# Patient Record
Sex: Male | Born: 1955 | Hispanic: Yes | Marital: Married | Smoking: Never smoker
Health system: Southern US, Community
[De-identification: ages and names within clinical notes are randomized; demographics above are authoritative.]

## PROBLEM LIST (undated history)

## (undated) HISTORY — PX: CARDIAC CATHETERIZATION: SHX172

---

## 2021-02-21 ENCOUNTER — Other Ambulatory Visit: Payer: Self-pay

## 2021-02-21 ENCOUNTER — Ambulatory Visit: Payer: 59 | Admitting: Cardiology

## 2021-02-21 ENCOUNTER — Encounter: Payer: Self-pay | Admitting: Cardiology

## 2021-02-21 VITALS — BP 142/61 | HR 102 | Temp 98.4°F | Resp 17 | Ht 65.0 in | Wt 157.0 lb

## 2021-02-21 DIAGNOSIS — I480 Paroxysmal atrial fibrillation: Secondary | ICD-10-CM

## 2021-02-21 DIAGNOSIS — I25119 Atherosclerotic heart disease of native coronary artery with unspecified angina pectoris: Secondary | ICD-10-CM

## 2021-02-21 DIAGNOSIS — I10 Essential (primary) hypertension: Secondary | ICD-10-CM

## 2021-02-21 DIAGNOSIS — R2681 Unsteadiness on feet: Secondary | ICD-10-CM

## 2021-02-21 DIAGNOSIS — I951 Orthostatic hypotension: Secondary | ICD-10-CM

## 2021-02-21 NOTE — Progress Notes (Addendum)
Primary Physician/Referring:  Pcp, No  Patient ID: Dennis Fry, male    DOB: 14-Sep-1955, 66 y.o.   MRN: [id number]  No chief complaint on file.  HPI:    Dennis Fry  is a 66 y.o. Caucasian male patient from [place] who came to the [place] on 02/13/2021 a week ago.  He has history of coronary artery disease, paroxysmal atrial fibrillation, hyperlipidemia, diabetes mellitus, had coronary stent implantation to the LAD implanted 3 months ago.  New onset of gait instability and generalized slowing and headache that started 5 to 6 days ago.    No chest pain, dyspnea, leg edema. No fall. C/O headache improved on Tylenol.  History reviewed. No pertinent past medical history. Past Surgical History:  Procedure Laterality Date   CARDIAC CATHETERIZATION     Family History  Problem Relation Age of Onset   Heart attack Father 36   Heart attack Sister 66   Heart attack Sister 61   Lung cancer Sister    Heart attack Brother     Social History   Tobacco Use   Smoking status: Never   Smokeless tobacco: Never  Substance Use Topics   Alcohol use: Never   Marital Status: Married  ROS  Review of Systems  Cardiovascular:  Negative for chest pain, dyspnea on exertion and leg swelling.  Neurological:  Positive for disturbances in coordination and headaches.  Objective  Blood pressure (!) 142/61, pulse (!) 102, temperature 98.4 F (36.9 C), temperature source Temporal, resp. rate 17, height $RemoveBe'5\' 5"'ihvQiIzZk$  (1.651 m), weight 157 lb (71.2 kg), SpO2 98 %. Body mass index is 26.13 kg/m.   Orthostatic VS for the past 72 hrs (Last 3 readings):  Orthostatic BP Patient Position BP Location Cuff Size Orthostatic Pulse  02/21/21 1236 138/51 Standing Left Arm Normal (!) 41  02/21/21 1235 124/61 Sitting Left Arm Normal (!) 48  02/21/21 1234 158/87 Supine Left Arm Normal 116    Vitals with BMI 02/21/2021  Height $Remov'5\' 5"'GHahDr$   Weight 157 lbs  BMI 02.72  Systolic 536  Diastolic 61  Pulse 644     Physical Exam Eyes:     Extraocular Movements: Extraocular movements intact.     Conjunctiva/sclera: Conjunctivae normal.  Neck:     Vascular: No carotid bruit or JVD.  Cardiovascular:     Rate and Rhythm: Normal rate and regular rhythm.     Pulses: Intact distal pulses.     Heart sounds: Normal heart sounds. No murmur heard.   No gallop.  Pulmonary:     Effort: Pulmonary effort is normal.     Breath sounds: Normal breath sounds.  Abdominal:     General: Bowel sounds are normal.     Palpations: Abdomen is soft.  Musculoskeletal:     Right lower leg: No edema.     Left lower leg: No edema.  Skin:    Capillary Refill: Capillary refill takes less than 2 seconds.  Neurological:     Motor: Weakness (left arm) present.     Gait: Gait abnormal.     Medications and allergies  No Known Allergies   Medication list after today's encounter   Current Outpatient Medications  Medication Instructions   apixaban (ELIQUIS) 5 mg, Oral, 2 times daily   aspirin EC 81 mg, Oral, Daily, Swallow whole.   atorvastatin (LIPITOR) 40 mg, Oral, Daily   bisoprolol (ZEBETA) 2.5 mg, Oral, Daily   empagliflozin (JARDIANCE) 25 MG TABS tablet Oral, Daily   esomeprazole (NEXIUM) 20 mg,  Oral, Daily   glipiZIDE (GLUCOTROL) 60 mg, Oral, Daily before breakfast   sitaGLIPtin-metformin (JANUMET) 50-1000 MG tablet 1 tablet, Oral, 2 times daily with meals   Laboratory xamination:   Recent Labs    02/21/21 1409  NA 136  K 4.2  CL 97  CO2 22  GLUCOSE 160*  BUN 19  CREATININE 0.87  CALCIUM 10.0   estimated creatinine clearance is 70.7 mL/min (by C-G formula based on SCr of 0.87 mg/dL).  CMP Latest Ref Rng & Units 02/21/2021  Glucose 70 - 99 mg/dL 160(H)  BUN 8 - 27 mg/dL 19  Creatinine 0.76 - 1.27 mg/dL 0.87  Sodium 134 - 144 mmol/L 136  Potassium 3.5 - 5.2 mmol/L 4.2  Chloride 96 - 106 mmol/L 97  CO2 20 - 29 mmol/L 22  Calcium 8.6 - 10.2 mg/dL 10.0  Total Protein 6.0 - 8.5 g/dL 7.9  Total  Bilirubin 0.0 - 1.2 mg/dL 0.4  Alkaline Phos 44 - 121 IU/L 144(H)  AST 0 - 40 IU/L 21  ALT 0 - 44 IU/L 16   CBC Latest Ref Rng & Units 02/21/2021  WBC 3.4 - 10.8 x10E3/uL 13.2(H)  Hemoglobin 13.0 - 17.7 g/dL 14.2  Hematocrit 37.5 - 51.0 % 44.6  Platelets 150 - 450 x10E3/uL 318    Radiology:     Cardiac Studies:    EKG:   02/21/2021: Normal sinus rhythm at rate of 65 bpm, normal axis, early repolarization.  PACs.  Assessment     ICD-10-CM   1. Coronary artery disease with angina pectoris, unspecified vessel or lesion type, unspecified whether native or transplanted heart ([place])  I25.119 CMP14+EGFR    CBC    CANCELED: EKG 12-Lead    2. Gait instability  R26.81 CT HEAD WO CONTRAST (5MM)    CT HEAD W & WO CONTRAST (5MM)    3. Paroxysmal atrial fibrillation (H[facility])  I48.0     4. Primary hypertension  I10     5. Orthostatic hypotension  I95.1        Medications Discontinued During This Encounter  Medication Reason   ARGATROBAN IV    METFORMIN HCL PO    clopidogrel (PLAVIX) 75 MG tablet Discontinued by provider    No orders of the defined types were placed in this encounter.  Orders Placed This Encounter  Procedures   CT HEAD WO CONTRAST (5MM)    [employer] (auth#[id number])  epic order WT 157 No special needs  No spinal cord stimulator/body injector/glucose/heart monitor/port attached No COVID Pansy with Ajanna @ [phone]     Standing Status:   Future    Standing Expiration Date:   02/21/2022    Order Specific Question:   Preferred imaging location?    Answer:   [id number]   CT HEAD W & WO CONTRAST (5MM)    Standing Status:   Future    Standing Expiration Date:   02/22/2022    Order Specific Question:   If indicated for the ordered procedure, I authorize the administration of contrast media per Radiology protocol    Answer:   Yes    Order Specific Question:   Preferred imaging location?    Answer:   [facility]   CMP14+EGFR   CBC   Recommendations:    Dennis Fry is a 66 y.o. Caucasian male patient from [place] who came to the [place] on 02/13/2021 a week ago.  He has history of coronary artery disease, paroxysmal atrial fibrillation, hyperlipidemia, diabetes  mellitus, had coronary stent implantation to the LAD implanted 3 months ago.  New onset of gait instability and generalized slowing and headache that started 5 to 6 days ago.  On physical exam he appears to have mild left arm weakness.  He also has gait instability.  I am concerned about either a stroke that occurred 5 days ago or intracerebral hemorrhage as patient is on aspirin, Plavix and also Eliquis. No nuchal rigidity.   They will try to get records from [place] in the interim I have recommended that they get a stat CT scan of the head to exclude intracerebral hemorrhage and also a CMP and a CBC to the minimum.  They would like him to travel back to [place] if there is not an acute emergency.  He is also mildly orthostatic today and suspect this could be from dehydration as well.  I did not make any changes to his medications however as coronary stent was 3 months ago, you could certainly discontinue either aspirin or Plavix and continue Eliquis.  Addendum: CMP and CBC normal.  We will change CT scan of the head to CT angiogram head.  Discontinue clopidogrel and continue Eliquis and aspirin.    Adrian Prows, MD, [facility] 02/22/2021, 10:25 AM Office: [phone]

## 2021-02-22 ENCOUNTER — Other Ambulatory Visit: Payer: Self-pay

## 2021-02-22 LAB — CMP14+EGFR
ALT: 16 IU/L (ref 0–44)
AST: 21 IU/L (ref 0–40)
Albumin/Globulin Ratio: 1.3 (ref 1.2–2.2)
Albumin: 4.4 g/dL (ref 3.8–4.8)
Alkaline Phosphatase: 144 IU/L — ABNORMAL HIGH (ref 44–121)
BUN/Creatinine Ratio: 22 (ref 10–24)
BUN: 19 mg/dL (ref 8–27)
Bilirubin Total: 0.4 mg/dL (ref 0.0–1.2)
CO2: 22 mmol/L (ref 20–29)
Calcium: 10 mg/dL (ref 8.6–10.2)
Chloride: 97 mmol/L (ref 96–106)
Creatinine, Ser: 0.87 mg/dL (ref 0.76–1.27)
Globulin, Total: 3.5 g/dL (ref 1.5–4.5)
Glucose: 160 mg/dL — ABNORMAL HIGH (ref 70–99)
Potassium: 4.2 mmol/L (ref 3.5–5.2)
Sodium: 136 mmol/L (ref 134–144)
Total Protein: 7.9 g/dL (ref 6.0–8.5)
eGFR: 94 mL/min/{1.73_m2} (ref >59)

## 2021-02-22 LAB — CBC
Hematocrit: 44.6 % (ref 37.5–51.0)
Hemoglobin: 14.2 g/dL (ref 13.0–17.7)
MCH: 22.9 pg — ABNORMAL LOW (ref 26.6–33.0)
MCHC: 31.8 g/dL (ref 31.5–35.7)
MCV: 72 fL — ABNORMAL LOW (ref 79–97)
Platelets: 318 10*3/uL (ref 150–450)
RBC: 6.2 x10E6/uL — ABNORMAL HIGH (ref 4.14–5.80)
RDW: 14.6 % (ref 11.6–15.4)
WBC: 13.2 10*3/uL — ABNORMAL HIGH (ref 3.4–10.8)

## 2021-02-22 NOTE — Addendum Note (Signed)
Addended by: Kela Millin on: 02/22/2021 10:25 AM   Modules accepted: Orders

## 2021-02-23 ENCOUNTER — Emergency Department (HOSPITAL_COMMUNITY): Payer: Commercial Managed Care - PPO

## 2021-02-23 ENCOUNTER — Inpatient Hospital Stay (HOSPITAL_COMMUNITY): Payer: Commercial Managed Care - PPO

## 2021-02-23 ENCOUNTER — Inpatient Hospital Stay (HOSPITAL_COMMUNITY)
Admission: EM | Admit: 2021-02-23 | Discharge: 2021-02-24 | DRG: 054 | Discharge status: Against Medical Advice | Payer: Commercial Managed Care - PPO | Attending: Neurology | Admitting: Neurology

## 2021-02-23 ENCOUNTER — Ambulatory Visit
Admission: RE | Admit: 2021-02-23 | Discharge: 2021-02-23 | Disposition: A | Discharge status: Home / Self Care | Payer: 59 | Source: Ambulatory Visit | Attending: Cardiology | Admitting: Cardiology

## 2021-02-23 ENCOUNTER — Other Ambulatory Visit: Payer: Self-pay | Admitting: Internal Medicine

## 2021-02-23 ENCOUNTER — Other Ambulatory Visit: Payer: Self-pay

## 2021-02-23 DIAGNOSIS — Z20822 Contact with and (suspected) exposure to covid-19: Secondary | ICD-10-CM | POA: Diagnosis present

## 2021-02-23 DIAGNOSIS — Z8249 Family history of ischemic heart disease and other diseases of the circulatory system: Secondary | ICD-10-CM | POA: Diagnosis not present

## 2021-02-23 DIAGNOSIS — I2510 Atherosclerotic heart disease of native coronary artery without angina pectoris: Secondary | ICD-10-CM | POA: Diagnosis present

## 2021-02-23 DIAGNOSIS — I6389 Other cerebral infarction: Secondary | ICD-10-CM

## 2021-02-23 DIAGNOSIS — I480 Paroxysmal atrial fibrillation: Secondary | ICD-10-CM | POA: Diagnosis present

## 2021-02-23 DIAGNOSIS — Z79899 Other long term (current) drug therapy: Secondary | ICD-10-CM

## 2021-02-23 DIAGNOSIS — Z7901 Long term (current) use of anticoagulants: Secondary | ICD-10-CM

## 2021-02-23 DIAGNOSIS — C719 Malignant neoplasm of brain, unspecified: Secondary | ICD-10-CM | POA: Diagnosis present

## 2021-02-23 DIAGNOSIS — R262 Difficulty in walking, not elsewhere classified: Secondary | ICD-10-CM | POA: Diagnosis present

## 2021-02-23 DIAGNOSIS — Z7984 Long term (current) use of oral hypoglycemic drugs: Secondary | ICD-10-CM

## 2021-02-23 DIAGNOSIS — Z955 Presence of coronary angioplasty implant and graft: Secondary | ICD-10-CM | POA: Diagnosis not present

## 2021-02-23 DIAGNOSIS — E119 Type 2 diabetes mellitus without complications: Secondary | ICD-10-CM | POA: Diagnosis present

## 2021-02-23 DIAGNOSIS — I639 Cerebral infarction, unspecified: Secondary | ICD-10-CM

## 2021-02-23 DIAGNOSIS — I10 Essential (primary) hypertension: Secondary | ICD-10-CM | POA: Diagnosis present

## 2021-02-23 DIAGNOSIS — E785 Hyperlipidemia, unspecified: Secondary | ICD-10-CM | POA: Diagnosis present

## 2021-02-23 DIAGNOSIS — C801 Malignant (primary) neoplasm, unspecified: Secondary | ICD-10-CM

## 2021-02-23 DIAGNOSIS — G936 Cerebral edema: Secondary | ICD-10-CM | POA: Diagnosis present

## 2021-02-23 DIAGNOSIS — Z801 Family history of malignant neoplasm of trachea, bronchus and lung: Secondary | ICD-10-CM | POA: Diagnosis not present

## 2021-02-23 DIAGNOSIS — R2689 Other abnormalities of gait and mobility: Secondary | ICD-10-CM | POA: Diagnosis present

## 2021-02-23 DIAGNOSIS — Z7902 Long term (current) use of antithrombotics/antiplatelets: Secondary | ICD-10-CM | POA: Diagnosis not present

## 2021-02-23 DIAGNOSIS — R2681 Unsteadiness on feet: Secondary | ICD-10-CM

## 2021-02-23 LAB — CBC
HCT: 45.6 % (ref 39.0–52.0)
Hemoglobin: 14.2 g/dL (ref 13.0–17.0)
MCH: 22.5 pg — ABNORMAL LOW (ref 26.0–34.0)
MCHC: 31.1 g/dL (ref 30.0–36.0)
MCV: 72.2 fL — ABNORMAL LOW (ref 80.0–100.0)
Platelets: 294 10*3/uL (ref 150–400)
RBC: 6.32 MIL/uL — ABNORMAL HIGH (ref 4.22–5.81)
RDW: 14.7 % (ref 11.5–15.5)
WBC: 13.4 10*3/uL — ABNORMAL HIGH (ref 4.0–10.5)
nRBC: 0 % (ref 0.0–0.2)

## 2021-02-23 LAB — ECHOCARDIOGRAM COMPLETE
AV Vena cont: 0.1 cm
Area-P 1/2: 2.99 cm2
Calc EF: 61.2 %
Height: 65 in
P 1/2 time: 691 msec
S' Lateral: 2.5 cm
Single Plane A2C EF: 59.2 %
Single Plane A4C EF: 62.9 %
Weight: 2512 oz

## 2021-02-23 LAB — COMPREHENSIVE METABOLIC PANEL
ALT: 16 U/L (ref 0–44)
AST: 19 U/L (ref 15–41)
Albumin: 3.7 g/dL (ref 3.5–5.0)
Alkaline Phosphatase: 86 U/L (ref 38–126)
Anion gap: 14 (ref 5–15)
BUN: 22 mg/dL (ref 8–23)
CO2: 20 mmol/L — ABNORMAL LOW (ref 22–32)
Calcium: 8.9 mg/dL (ref 8.9–10.3)
Chloride: 97 mmol/L — ABNORMAL LOW (ref 98–111)
Creatinine, Ser: 0.85 mg/dL (ref 0.61–1.24)
GFR, Estimated: >60 mL/min (ref >60)
Glucose, Bld: 92 mg/dL (ref 70–99)
Potassium: 3.7 mmol/L (ref 3.5–5.1)
Sodium: 131 mmol/L — ABNORMAL LOW (ref 135–145)
Total Bilirubin: 1 mg/dL (ref 0.3–1.2)
Total Protein: 7.7 g/dL (ref 6.5–8.1)

## 2021-02-23 LAB — LIPID PANEL
Cholesterol: 107 mg/dL (ref 0–200)
HDL: 29 mg/dL — ABNORMAL LOW (ref >40)
LDL Cholesterol: 57 mg/dL (ref 0–99)
Total CHOL/HDL Ratio: 3.7 RATIO
Triglycerides: 107 mg/dL (ref <150)
VLDL: 21 mg/dL (ref 0–40)

## 2021-02-23 LAB — RAPID URINE DRUG SCREEN, HOSP PERFORMED
Amphetamines: NOT DETECTED
Barbiturates: NOT DETECTED
Benzodiazepines: NOT DETECTED
Cocaine: NOT DETECTED
Opiates: NOT DETECTED
Tetrahydrocannabinol: NOT DETECTED

## 2021-02-23 LAB — I-STAT CHEM 8, ED
BUN: 22 mg/dL (ref 8–23)
Calcium, Ion: 1.05 mmol/L — ABNORMAL LOW (ref 1.15–1.40)
Chloride: 100 mmol/L (ref 98–111)
Creatinine, Ser: 0.7 mg/dL (ref 0.61–1.24)
Glucose, Bld: 93 mg/dL (ref 70–99)
HCT: 47 % (ref 39.0–52.0)
Hemoglobin: 16 g/dL (ref 13.0–17.0)
Potassium: 3.8 mmol/L (ref 3.5–5.1)
Sodium: 132 mmol/L — ABNORMAL LOW (ref 135–145)
TCO2: 23 mmol/L (ref 22–32)

## 2021-02-23 LAB — DIFFERENTIAL
Abs Immature Granulocytes: 0.13 10*3/uL — ABNORMAL HIGH (ref 0.00–0.07)
Basophils Absolute: 0 10*3/uL (ref 0.0–0.1)
Basophils Relative: 0 %
Eosinophils Absolute: 0.3 10*3/uL (ref 0.0–0.5)
Eosinophils Relative: 2 %
Immature Granulocytes: 1 %
Lymphocytes Relative: 26 %
Lymphs Abs: 3.5 10*3/uL (ref 0.7–4.0)
Monocytes Absolute: 0.9 10*3/uL (ref 0.1–1.0)
Monocytes Relative: 7 %
Neutro Abs: 8.4 10*3/uL — ABNORMAL HIGH (ref 1.7–7.7)
Neutrophils Relative %: 64 %

## 2021-02-23 LAB — RESP PANEL BY RT-PCR (FLU A&B, COVID) ARPGX2
Influenza A by PCR: NEGATIVE
Influenza B by PCR: NEGATIVE
SARS Coronavirus 2 by RT PCR: NEGATIVE

## 2021-02-23 LAB — APTT: aPTT: 33 seconds (ref 24–36)

## 2021-02-23 LAB — PROTIME-INR
INR: 1.3 — ABNORMAL HIGH (ref 0.8–1.2)
Prothrombin Time: 16.4 seconds — ABNORMAL HIGH (ref 11.4–15.2)

## 2021-02-23 LAB — HEMOGLOBIN A1C
Hgb A1c MFr Bld: 8.9 % — ABNORMAL HIGH (ref 4.8–5.6)
Mean Plasma Glucose: 208.73 mg/dL

## 2021-02-23 LAB — SODIUM: Sodium: 132 mmol/L — ABNORMAL LOW (ref 135–145)

## 2021-02-23 LAB — ETHANOL: Alcohol, Ethyl (B): <10 mg/dL (ref <10)

## 2021-02-23 IMAGING — CT CT HEAD W/O CM
4 series · 15 of 47 positions shown, 17 images · non-contrast
Comparison: None.

CLINICAL DATA: Gait instability.



[Series 3: head 5.00 hr40 s3 axial ibhc · axial · 0.46mm/px · z∈[-603,-473]mm · 7 of 36 slices shown, 9 images]
[im 5/36  brain]
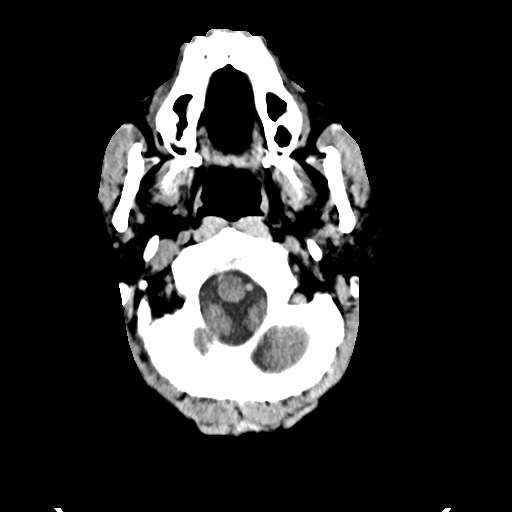
[im 5/36  bone]
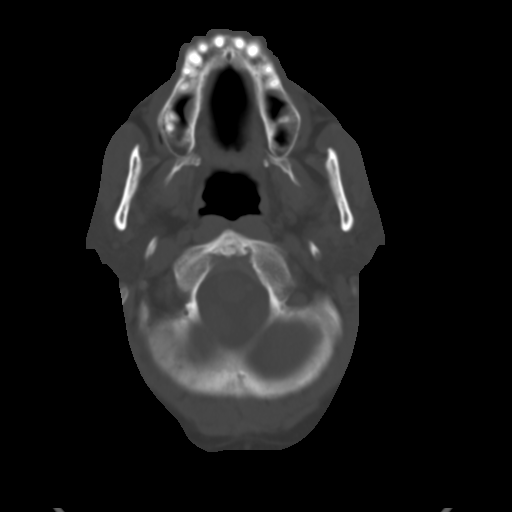
[im 9/36  brain]
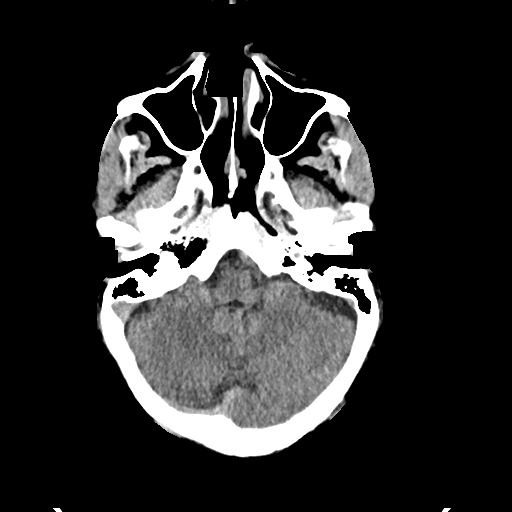
[im 14/36  brain]
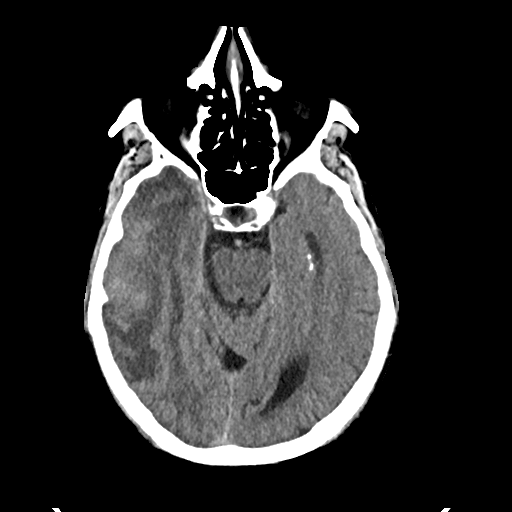
[im 18/36  brain]
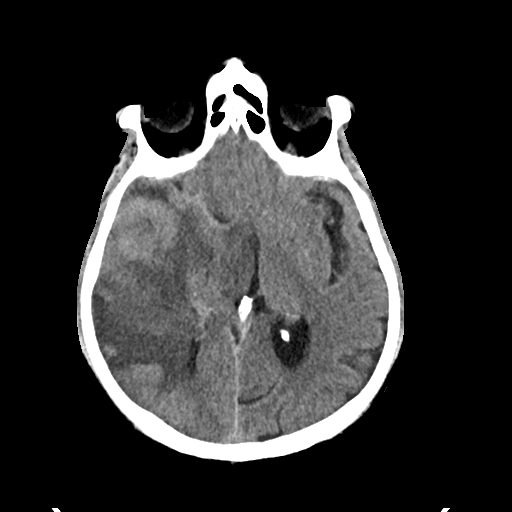
[im 22/36  brain]
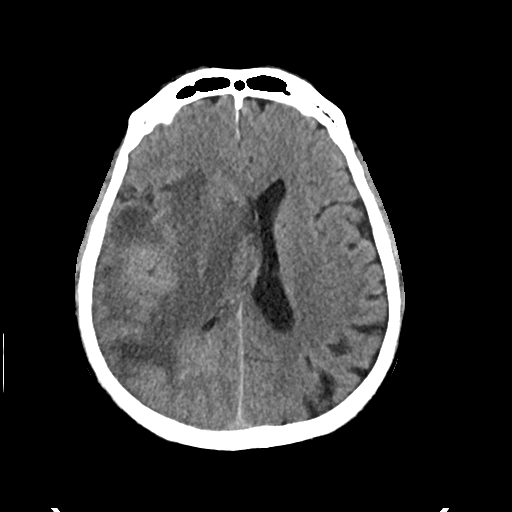
[im 22/36  bone]
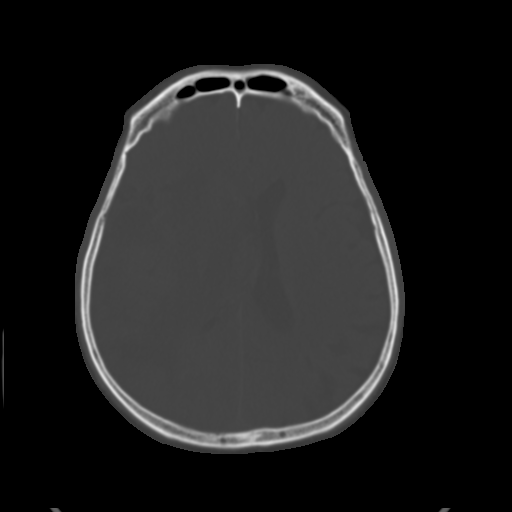
[im 27/36  brain]
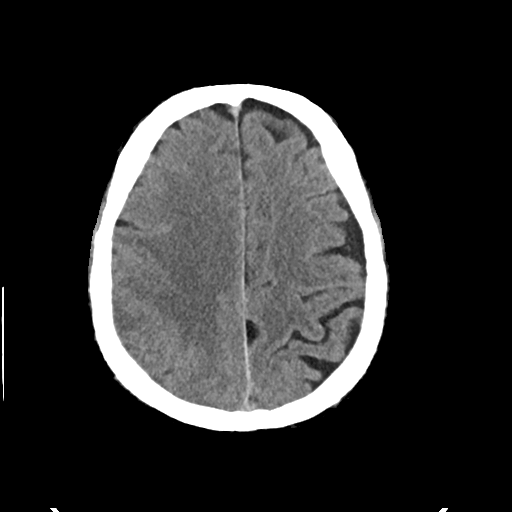
[im 31/36  brain]
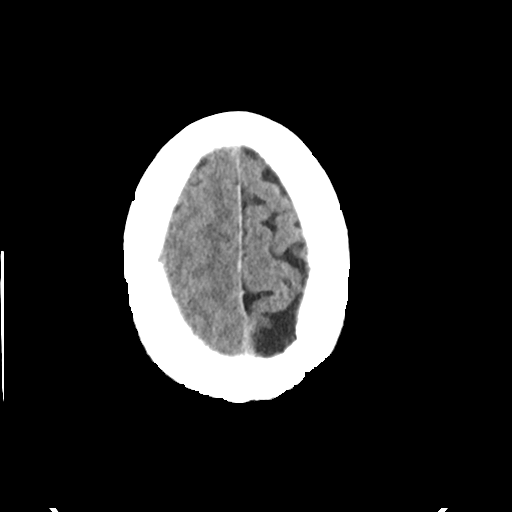

[Series 4: head 2.00 hr60 s3 axial bone · axial · 0.46mm/px · z∈[-606,-588]mm · 2 of 91 slices shown]
[im 10/91  bone]
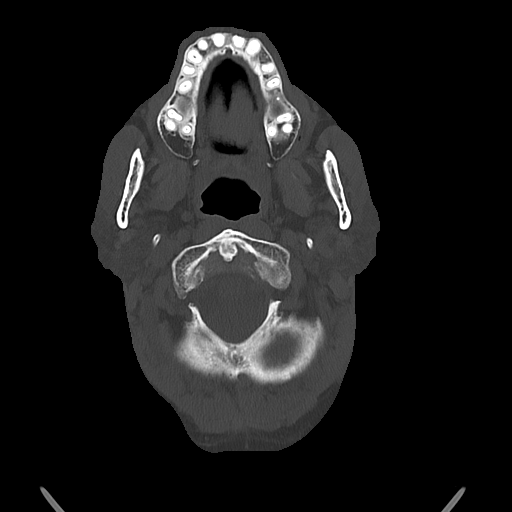
[im 19/91  bone]
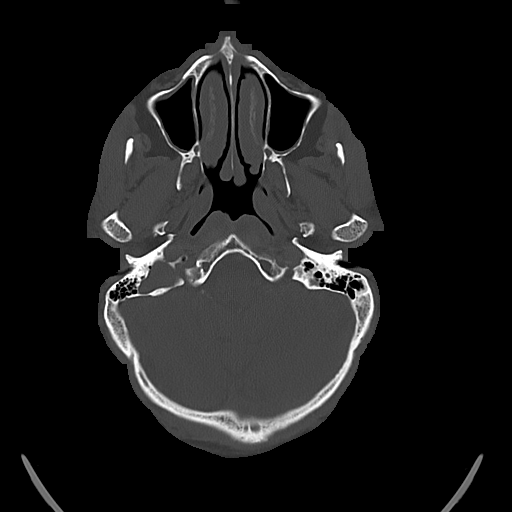

[Series 5: head 3.00 hr40 s3 sag · sagittal · 0.35mm/px · 3 of 57 slices shown]
[im 19/57  brain]
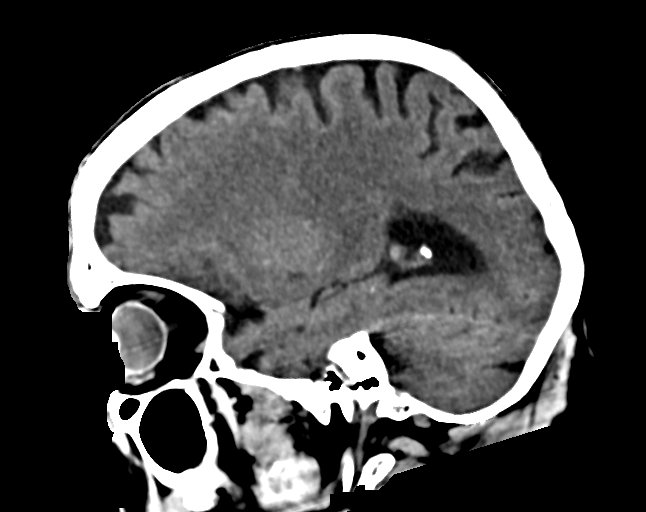
[im 29/57  brain]
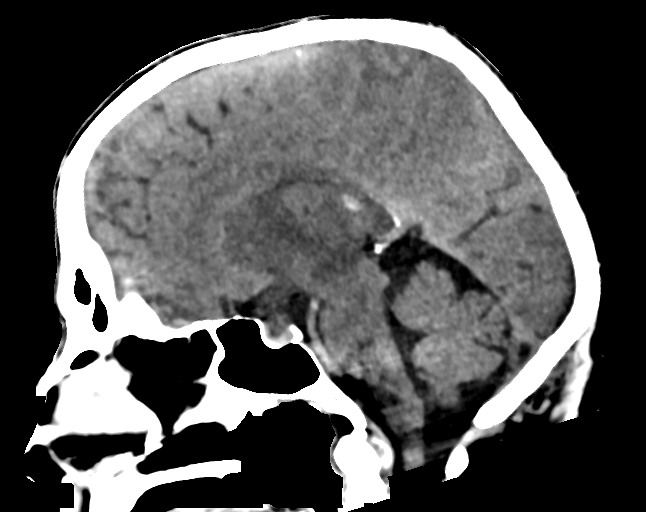
[im 38/57  brain]
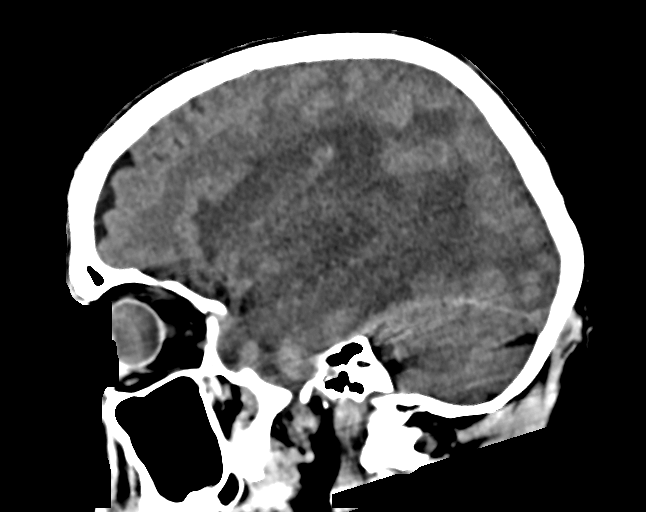

[Series 7: head 3.00 hr40 s3 cor · coronal · 0.34mm/px · 3 of 75 slices shown]
[im 25/75  brain]
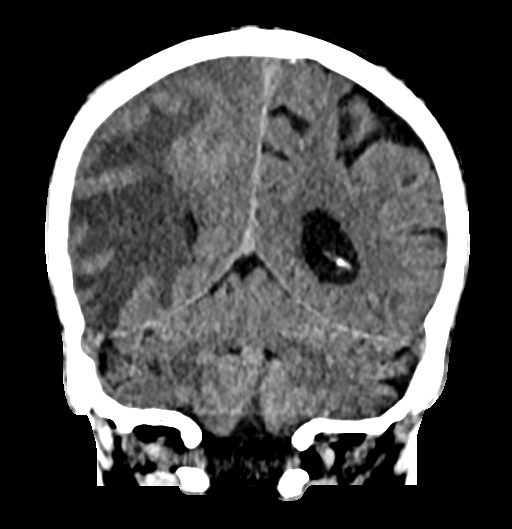
[im 33/75  brain]
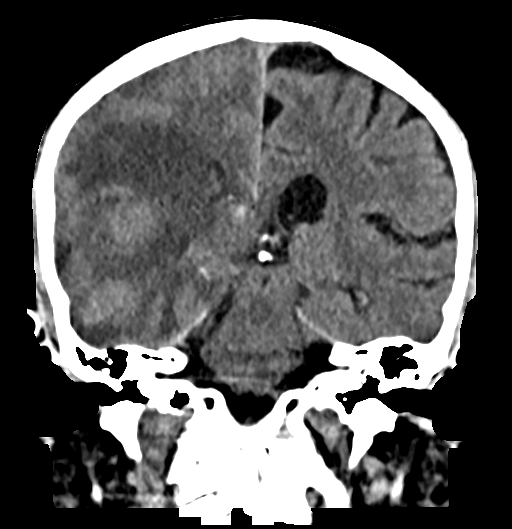
[im 42/75  brain]
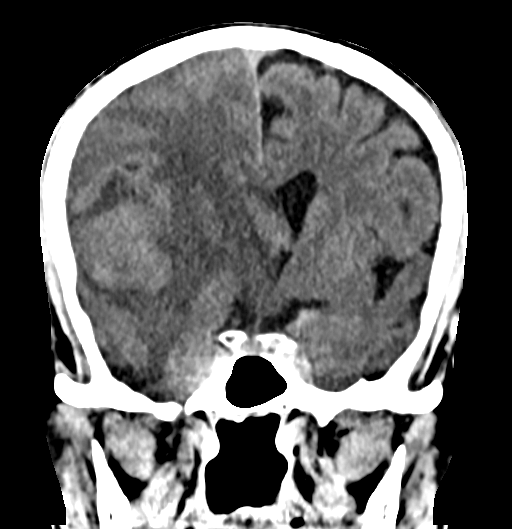

[15 of 47 positions shown; findings below may reference images not displayed]

FINDINGS: Brain: There is extensive subcortical low density consistent with
edema resulting in significant mass effect in midline shift. There
is approximately 11 mm of right to left midline shift. Compression
of the right lateral ventricle is noted. High density material is
noted in the right temporal horn which may represent the choroid
plexus which is accentuated due to the surrounding low density,
although possible intra ventricular hemorrhage cannot be excluded.
Prominence of the cortical gyri of the right temporal and parietal
lobes is also noted which most likely is essential weighted due to
the surrounding low density, although underlying mass or possible
hemorrhage cannot be excluded.

Vascular: Relative high density is noted in the right MCA which may
be due to extend to a shin due to the surrounding low density,
although thrombus cannot be excluded.

Skull: Normal. Negative for fracture or focal lesion.

Sinuses/Orbits: No acute finding.

Other: None.
IMPRESSION: Extensive probable subcortical white matter low density is noted
resulting in significant mass effect and 11 mm of right to left
midline shift. Hyperdensity is noted in right temporal horn which
most likely represents choroid plexus accentuated due to surrounding
low density, but intraventricular hemorrhage cannot be excluded.
Prominent and thickened cortical gyri are noted in right temporal
and parietal cortices which are more prominent most likely due to
surrounding low density, but hemorrhage cannot be excluded.
Possibility of right MCA thrombus cannot be excluded. Further
evaluation with MRI is recommended. Critical Value/emergent results
were called by telephone at the time of interpretation on [DATE]
at [DATE] to provider MUEMIN , who verbally acknowledged these
results.

## 2021-02-23 IMAGING — MR MR HEAD W/O CM
4 of 8 series · 13 of 48 positions shown · non-contrast
Comparison: No pertinent prior exams available for comparison.

CLINICAL DATA: Provided history: Neuro deficit, acute, stroke
suspected.

EXAM:
MRI HEAD WITHOUT CONTRAST
TECHNIQUE: Multiplanar, multiecho pulse sequences of the brain and surrounding
structures were obtained without intravenous contrast.

[Series 3: DWI · axial · 3.0mm · 0.94mm/px · z∈[-84,+24]mm · 3 of 104 slices shown (1 of 2)]
[im 16/104]
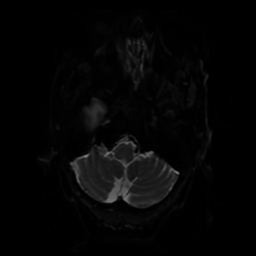
[im 56/104]
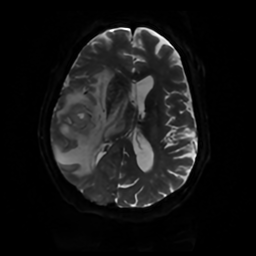
[im 88/104]
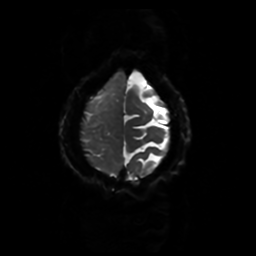

[Series 4: DWI · coronal · 4.0mm · 0.94mm/px · 3 of 76 slices shown (2 of 2)]
[im 9/76]
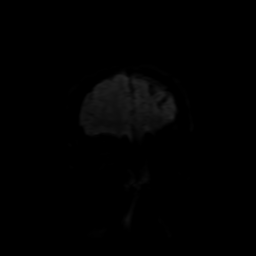
[im 42/76]
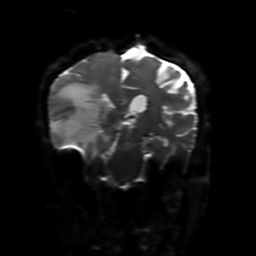
[im 67/76]
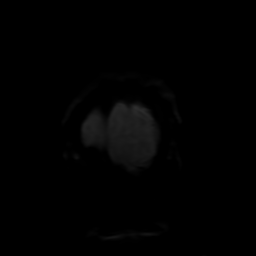

[Series 5: FLAIR · sagittal · 5.0mm · 0.23mm/px · 3 of 28 slices shown (1 of 2)]
[im 1/28]
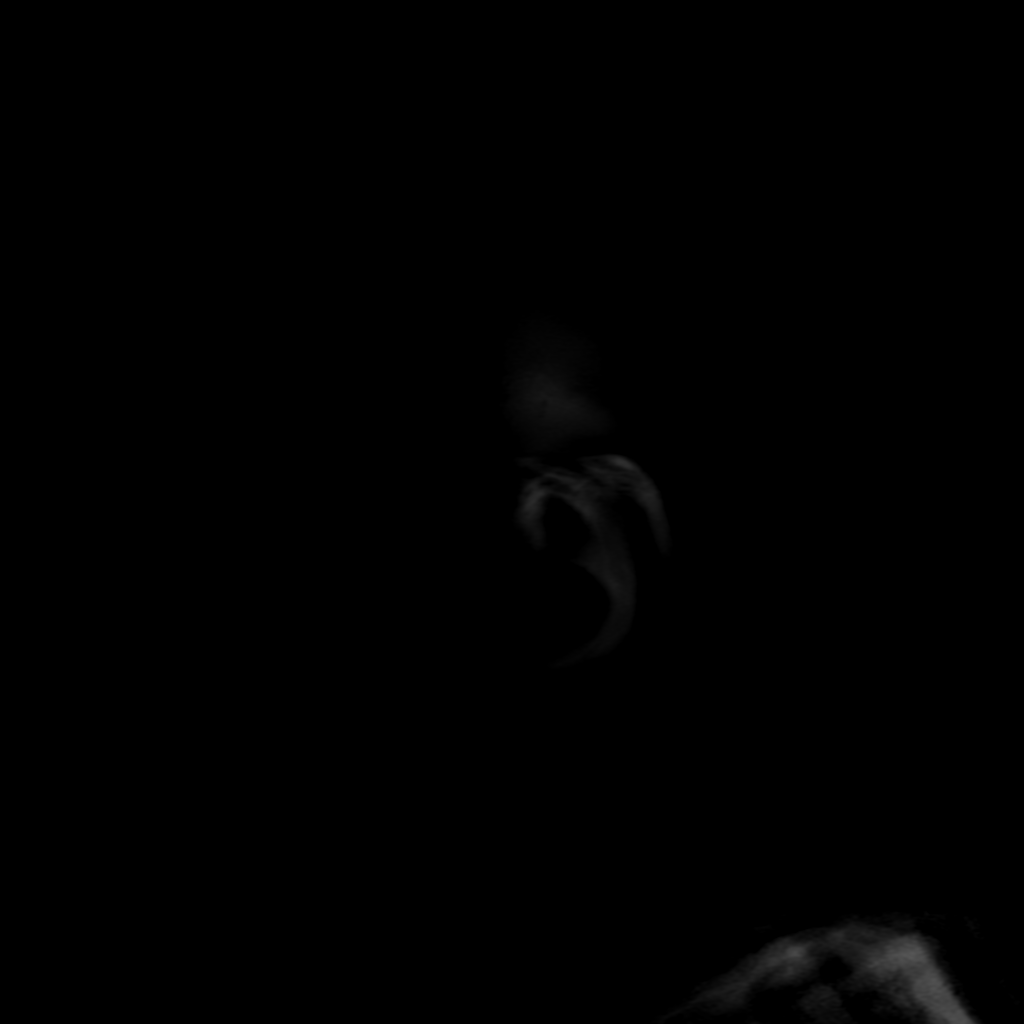
[im 14/28]
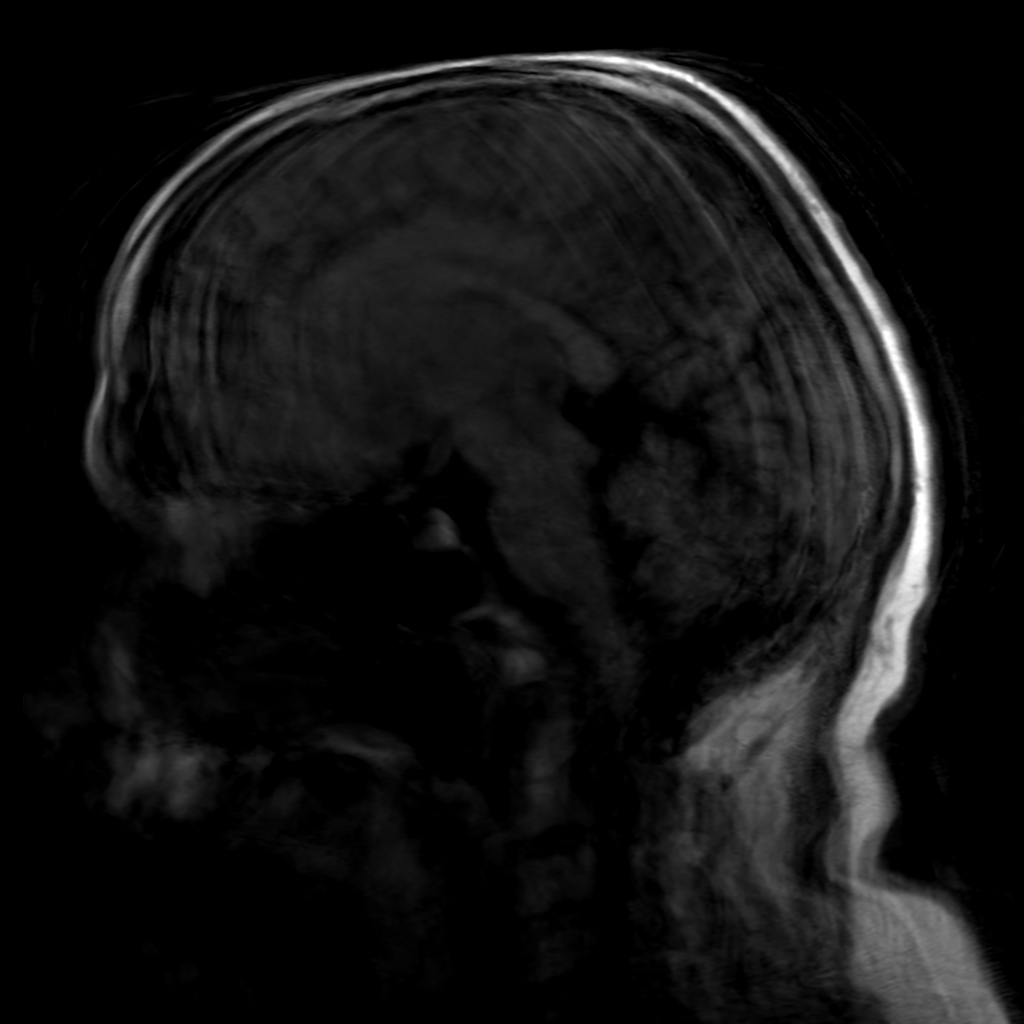
[im 28/28]
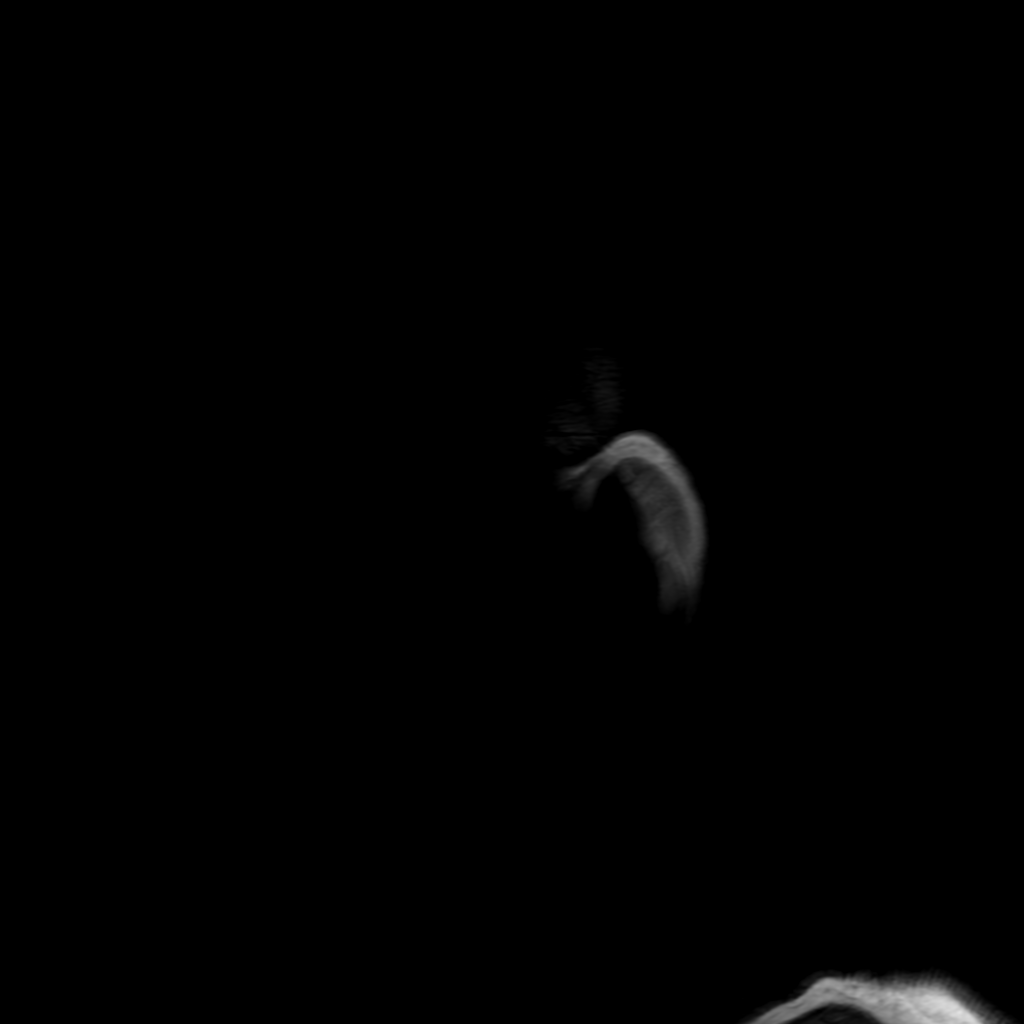

[Series 7: FLAIR · axial · 4.0mm · 0.45mm/px · z∈[-113,+41]mm · 4 of 36 slices shown (2 of 2)]
[im 1/36]
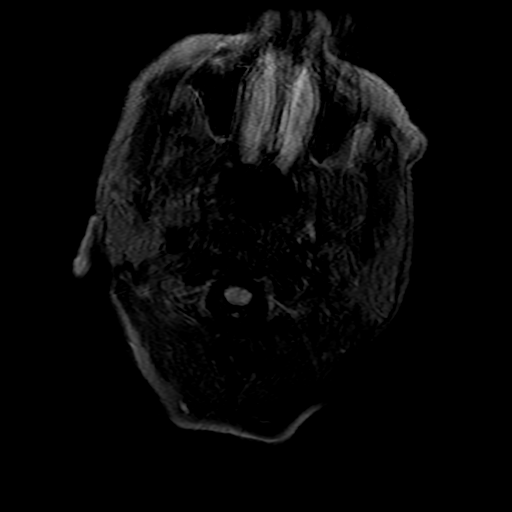
[im 12/36]
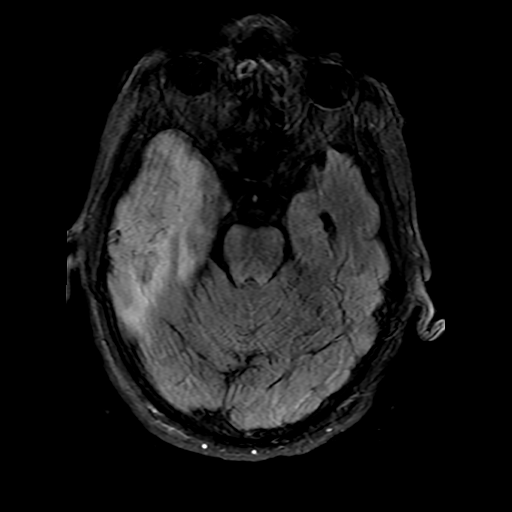
[im 24/36]
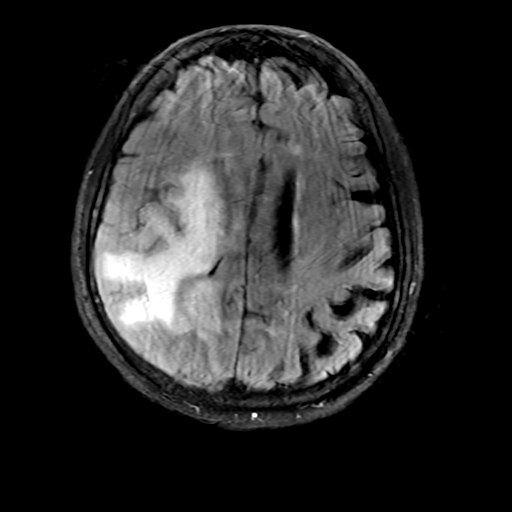
[im 36/36]
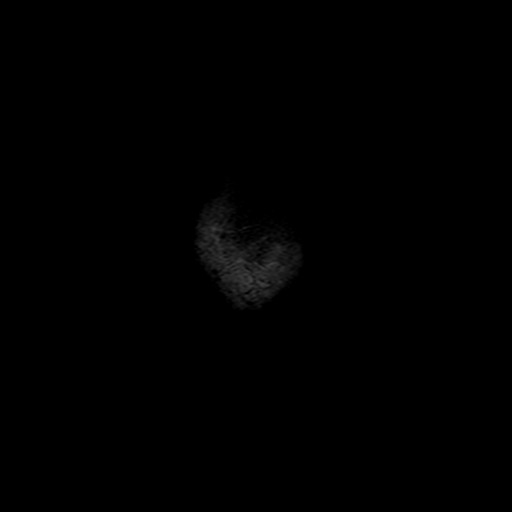

[13 of 48 positions shown; findings below may reference images not displayed]

FINDINGS: The patient was unable to tolerate the full examination. The
following sequences could be acquired prior to examination
termination: axial and coronal diffusion-weighted sequences,
sagittal T1-weighted sequence, axial T2 TSE sequences and an axial
T2 FLAIR sequence. The acquired sequences are significantly motion
degraded, limiting evaluation. Most notably, there is severe motion
degradation of the sagittal T1 weighted sequence, of the axial T2
TSE sequences and of the axial T2 FLAIR sequence.

Brain:

Mild generalized cerebral and cerebellar atrophy.

There is extensive cortical/subcortical T2 FLAIR hyperintense signal
abnormality within the right temporal, frontal, parietal and lateral
occipital lobes. Associated gyral thickening centered along the
right sylvian fissure. Extensive T2 FLAIR hyperintense signal
abnormality also extends into the right basal ganglia, right
internal and external capsules, right thalamus and right midbrain.
There is prominent mass effect with 13 mm leftward midline shift
measured at the level of the septum pellucidum with extensive
partial effacement of the right lateral ventricle. Additionally,
there is partial effacement of the basal cisterns on the right. No
convincing evidence of hydrocephalus or ventricular entrapment at
this time.

There are superimposed curvilinear and patchy foci of
diffusion-weighted signal abnormality centered along the right
sylvian fissure. Additional punctate focus of diffusion-weighted
signal abnormality within the right parietal lobe (series 3, image
32)). Additionally, there is a 2.7 cm globular focus of
diffusion-weighted and T2 FLAIR signal abnormality within the right
parietal white matter, extending to the margin of the right lateral
ventricle (for instance as seen on series 3, image 35).

Background multifocal T2 FLAIR hyperintense signal abnormality
within the cerebral white matter, nonspecific but compatible with
chronic small vessel ischemic disease.

No appreciable extra-axial fluid collection.

Vascular: Significant motion degradation precludes adequate
evaluation for proximal large arterial flow voids.

Skull and upper cervical spine: Within described limitations, no
definite focal suspicious marrow lesion is identified.

Sinuses/Orbits: Within described limitations, no acute orbital
abnormality is appreciated. Mild paranasal sinus disease was better
appreciated on the CT head performed earlier today.
IMPRESSION: Prematurely terminated, significantly motion degraded and
significantly limited non-contrast brain MRI.

Extensive T2 FLAIR hyperintense signal abnormality within the right
temporal, frontal, parietal and lateral occipital lobes extending
into the right basal ganglia, internal and external capsules and
midbrain as detailed. There are superimposed foci of
diffusion-weighted signal abnormality. Additionally, there is gyral
thickening centered along the right sylvian fissure. The imaging
features are nonspecific on this non-contrast and motion degraded
examination. Differential considerations include high-grade primary
CNS neoplasm/ Glioblastoma multiforme, lymphoma or metastatic
disease with extensive vasogenic edema. A recent large right MCA
territory infarct is also possible, but not favored. A repeat brain
MRI with contrast is recommended for better imaging characterization
when the patient is better able to tolerate the study.

Significant mass effect with 13 mm leftward midline shift measured
at the level of the septum pellucidum. Extensive partial effacement
of the right lateral ventricle. Additionally, there is partial
effacement of the basal cisterns on the right. No convincing
evidence of hydrocephalus or ventricular entrapment at this time.

Background generalized parenchymal atrophy and chronic small vessel
ischemic disease.

## 2021-02-23 IMAGING — CT CT ANGIO HEAD
2 of 9 series · 16 of 47 positions shown · non-contrast
Comparison: Comparison made with prior CTA and MRI from earlier the
same day.

CLINICAL DATA: Follow-up examination for acute stroke.

EXAM:
CT ANGIOGRAPHY HEAD
TECHNIQUE: Multidetector CT imaging of the head was performed using the
standard protocol during bolus administration of intravenous
contrast. Multiplanar CT image reconstructions and MIPs were
obtained to evaluate the vascular anatomy.

[Series 7: headangio 1.0 mpr ax · axial · 0.39mm/px · z∈[-135,+25]mm · 13 of 187 slices shown]
[im 13/187  brain]
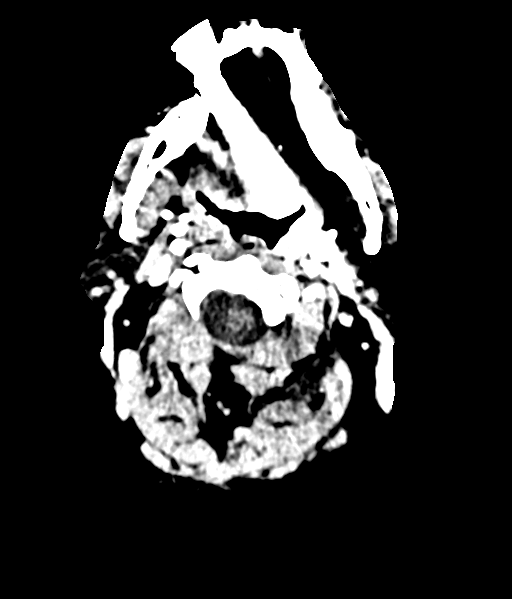
[im 25/187  bone]
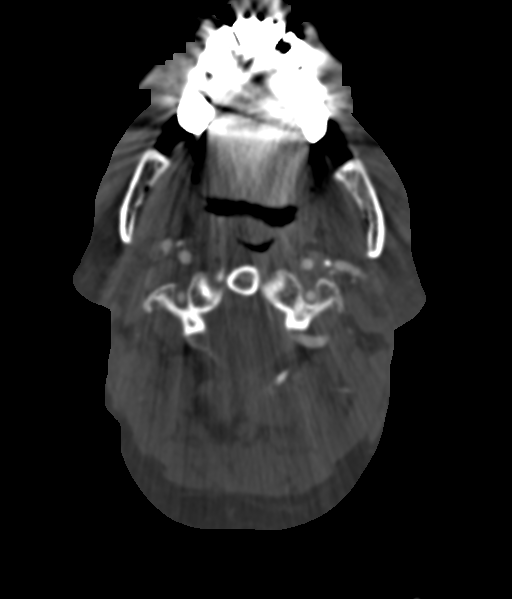
[im 38/187  brain]
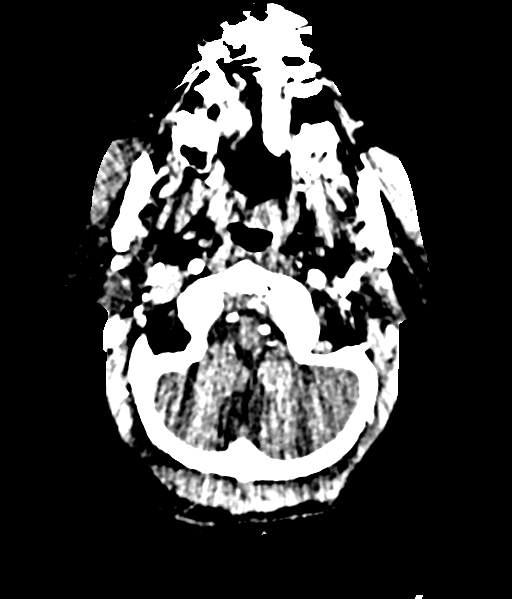
[im 50/187  bone]
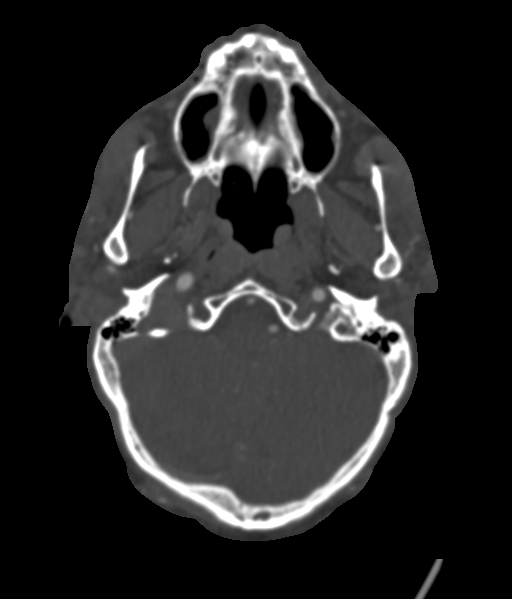
[im 63/187  brain]
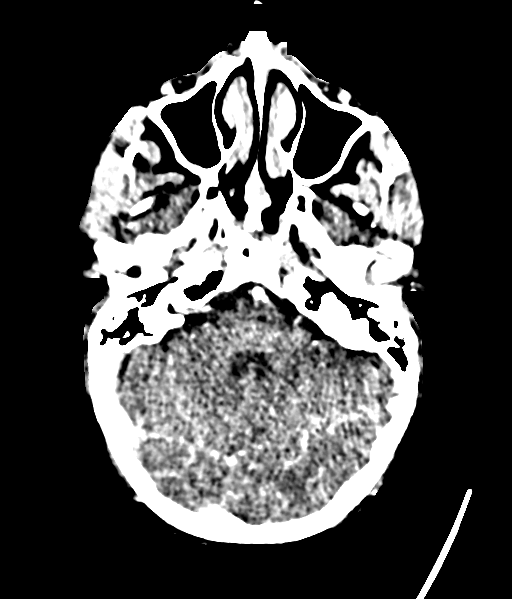
[im 75/187  bone]
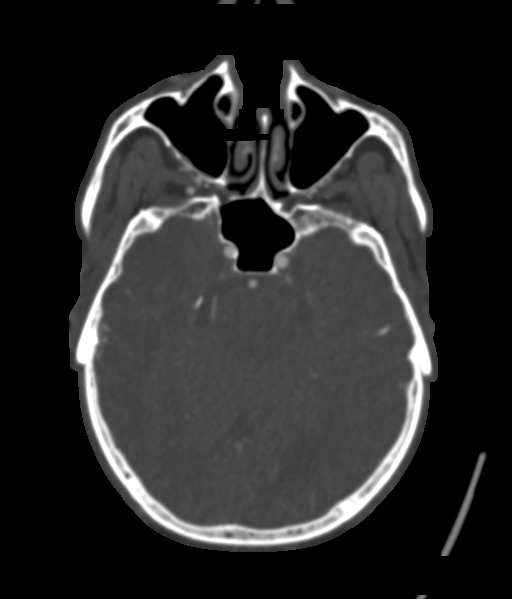
[im 100/187  brain]
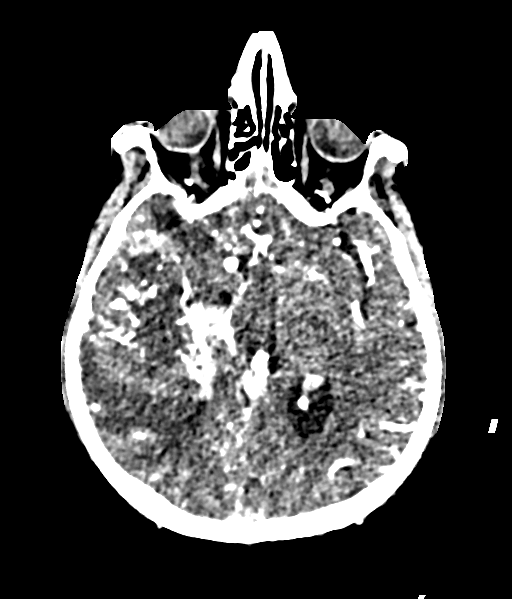
[im 112/187  bone]
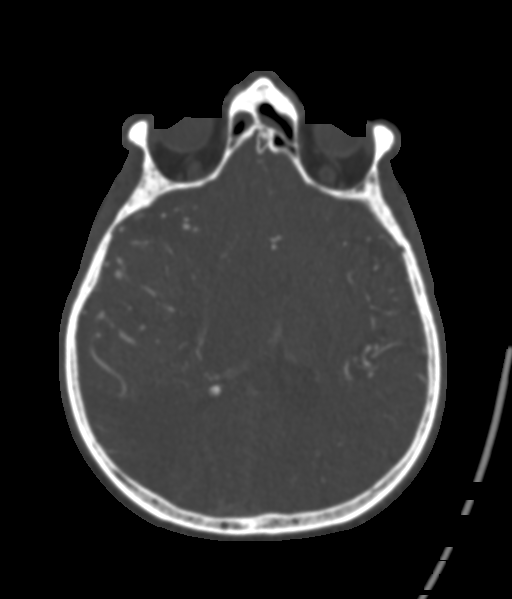
[im 125/187  brain]
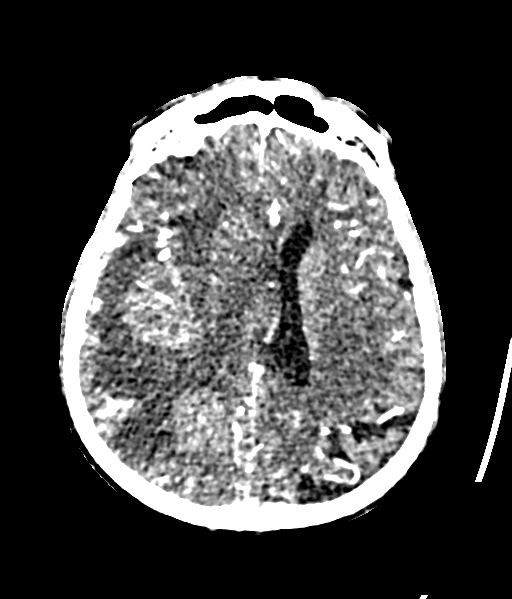
[im 137/187  bone]
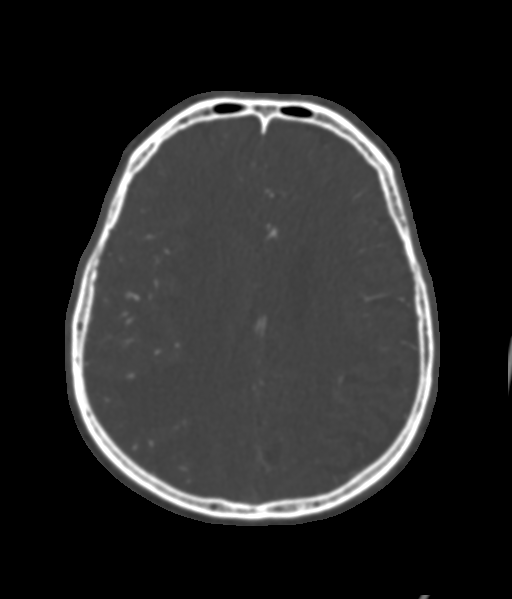
[im 149/187  brain]
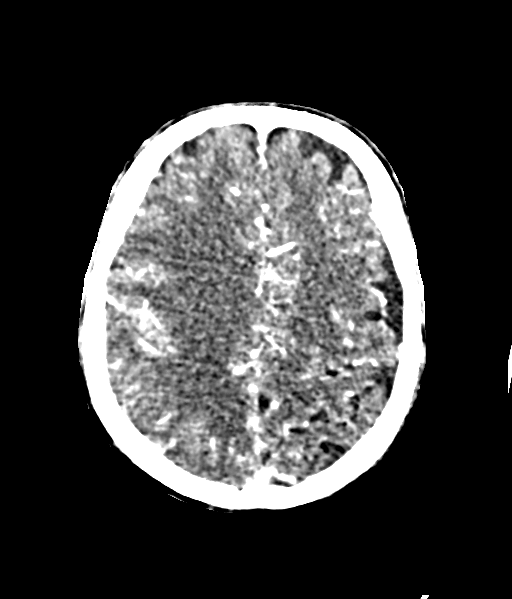
[im 162/187  bone]
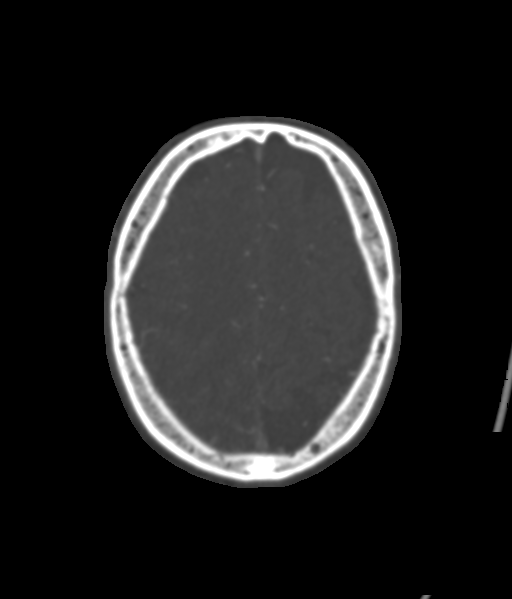
[im 174/187  brain]
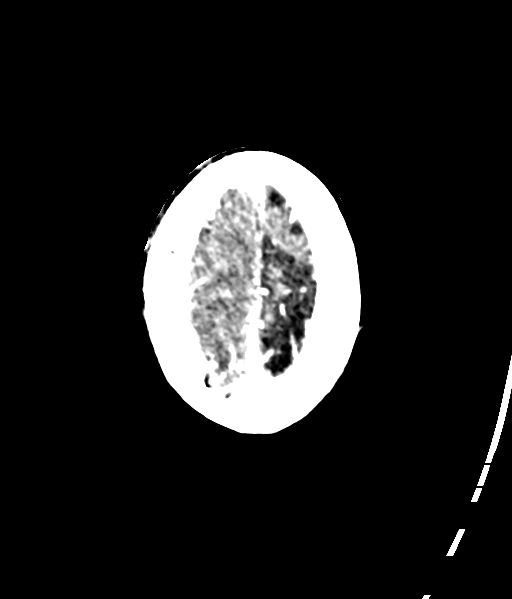

[Series 8: headangio 1.0 mpr cor · coronal · 0.38mm/px · 3 of 222 slices shown]
[im 64/222  brain]
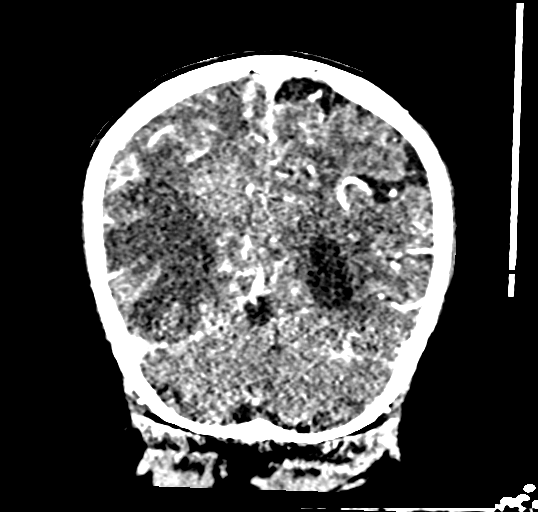
[im 95/222  brain]
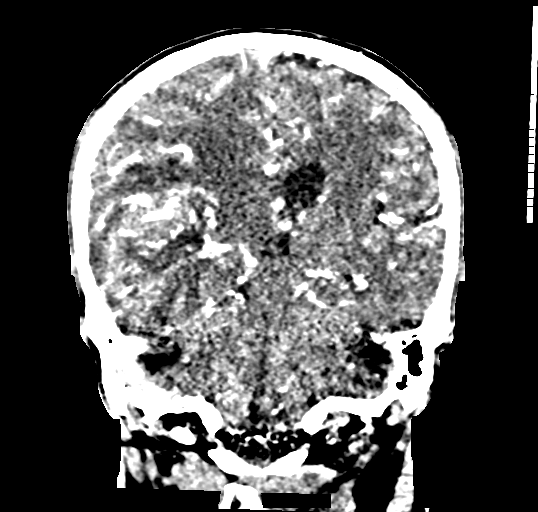
[im 127/222  brain]
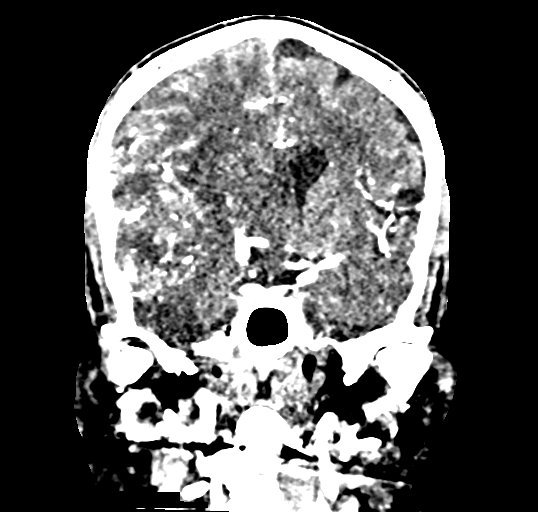

[16 of 47 positions shown; findings below may reference images not displayed]

RADIATION DOSE REDUCTION: This exam was performed according to the
departmental dose-optimization program which includes automated
exposure control, adjustment of the mA and/or kV according to
patient size and/or use of iterative reconstruction technique.

CONTRAST:  100mL OMNIPAQUE IOHEXOL 350 MG/ML SOLN
FINDINGS: CTA HEAD

Anterior circulation: Visualized distal cervical segments of the
internal carotid arteries are widely patent bilaterally. Mild
atheromatous change within the carotid siphons without significant
stenosis. A1 segments patent bilaterally. Left A1 hypoplastic.
Normal anterior communicating artery complex. Anterior cerebral
arteries deviated to the left but remain widely patent without
stenosis. No M1 stenosis or occlusion. Normal MCA bifurcations. MCA
branches well perfused and patent bilaterally. Mass effect on the
right MCA branches related to the parenchymal abnormality within the
right frontotemporal region. Note also made of prominent
neovascularity within this region, likely related to an underlying
malignant process.

Posterior circulation: Both vertebral arteries widely patent to the
vertebrobasilar junction without stenosis. Left PICA patent. Right
PICA not seen. Basilar patent to its distal aspect without stenosis.
Superior cerebellar arteries patent bilaterally. Both PCAs primarily
supplied via the basilar. PCAs patent to their distal aspects
without stenosis. Mild mass effect on the right PCA noted.

Venous sinuses: Not well assessed due to timing of the contrast
bolus.

Anatomic variants: None significant.  No aneurysm.

Review of the MIP images confirms the above findings.
IMPRESSION: 1. Negative CTA of the head. No large vessel occlusion,
hemodynamically significant stenosis, or other acute vascular
abnormality.
2. Regional mass effect with neovascularity within the right
frontotemporal region, suspicious for an underlying malignant
process. Further assessment with dedicated MRI of the brain, with
and without contrast, recommended as the patient is able to
tolerate.

## 2021-02-23 IMAGING — MR MR HEAD WO/W CM
9 of 14 series · 32 of 48 positions shown · IV contrast (Yes   MULTIHANCE)
Comparison: Multiple previous studies from earlier the same day.

CLINICAL DATA: Follow-up examination for stroke.

EXAM:
MRI HEAD WITHOUT AND WITH CONTRAST
TECHNIQUE: Multiplanar, multiecho pulse sequences of the brain and surrounding
structures were obtained without and with intravenous contrast.
CONTRAST:  7mL GADAVIST GADOBUTROL 1 MMOL/ML IV SOLN

[Series 3: DWI · axial · 3.0mm · 1.09mm/px · z∈[-44,+108]mm · 7 of 106 slices shown (1 of 4)]
[im 1/106]
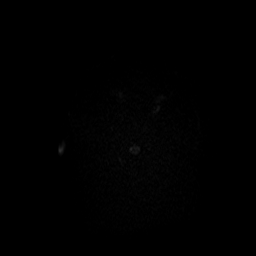
[im 18/106]
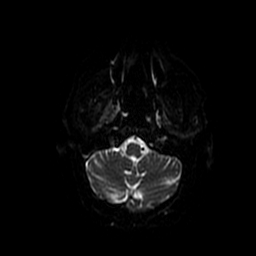
[im 36/106]
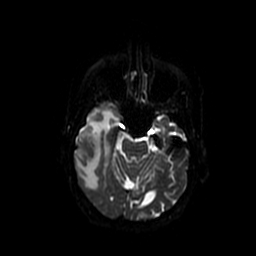
[im 53/106]
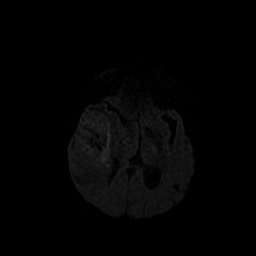
[im 71/106]
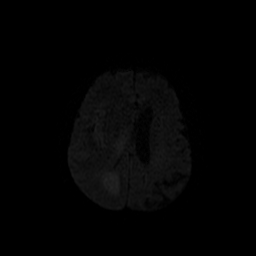
[im 88/106]
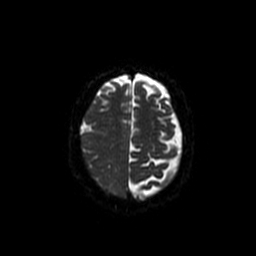
[im 106/106]
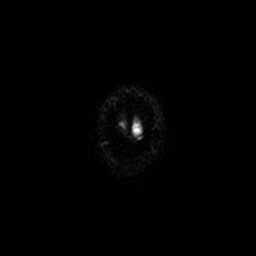

[Series 4: DWI · coronal · 5.0mm · 1.09mm/px · 6 of 76 slices shown (2 of 4)]
[im 1/76]
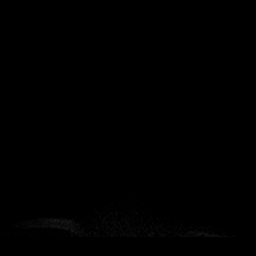
[im 16/76]
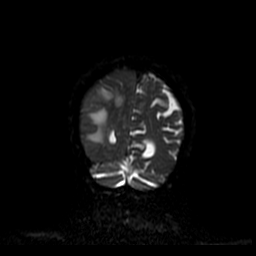
[im 31/76]
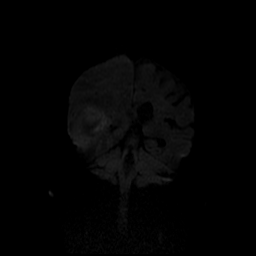
[im 46/76]
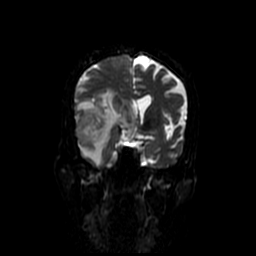
[im 61/76]
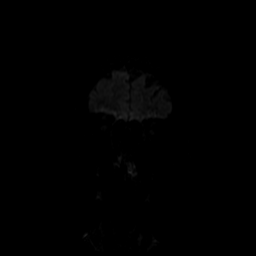
[im 76/76]
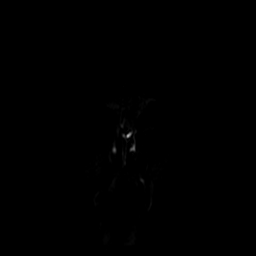

[Series 7: T2 · axial · 5.0mm · 0.47mm/px · z∈[-54,+99]mm · 2 of 27 slices shown]
[im 1/27]
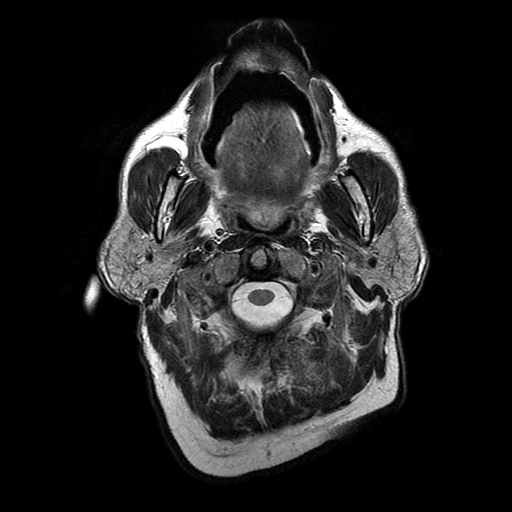
[im 27/27]
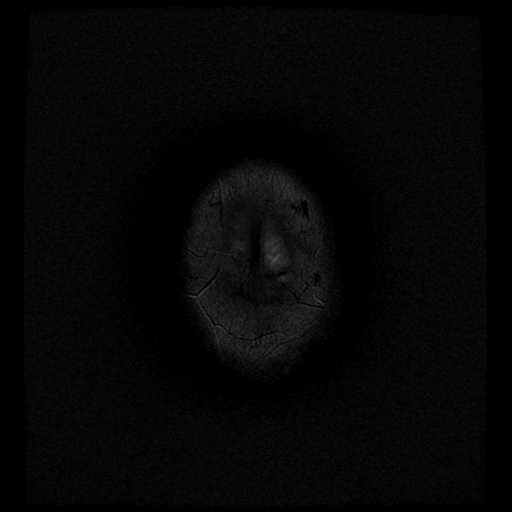

[Series 8: FLAIR · axial · 3.0mm · 0.47mm/px · z∈[-54,+99]mm · 2 of 27 slices shown]
[im 1/27]
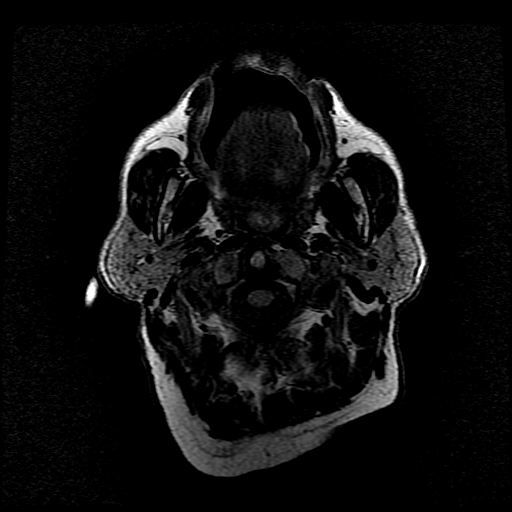
[im 27/27]
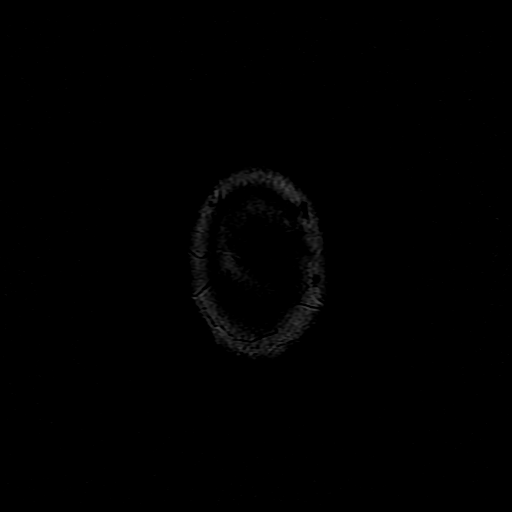

[Series 11: T1 post-contrast · axial · 3.0mm · 0.47mm/px · z∈[-58,+103]mm · 4 of 56 slices shown (1 of 3)]
[im 1/56]
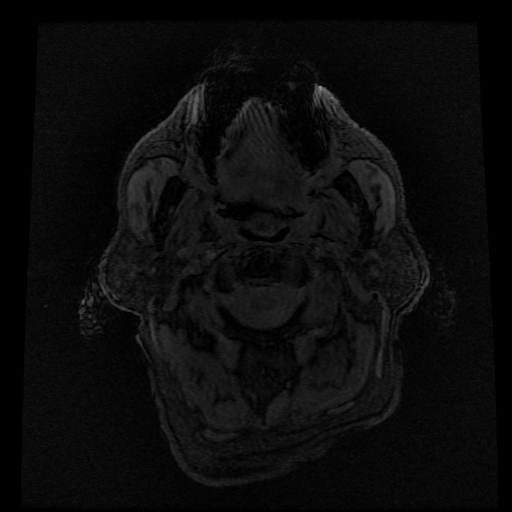
[im 19/56]
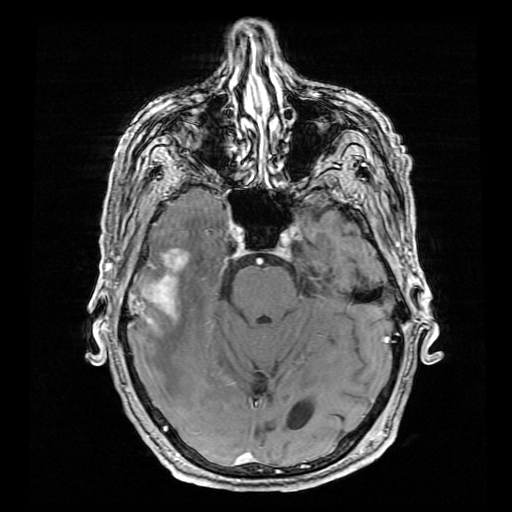
[im 37/56]
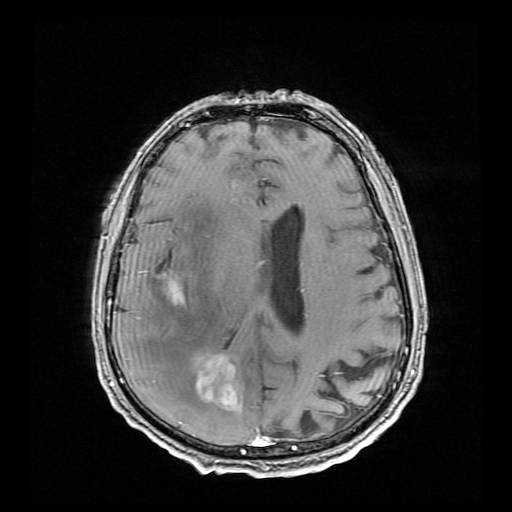
[im 56/56]
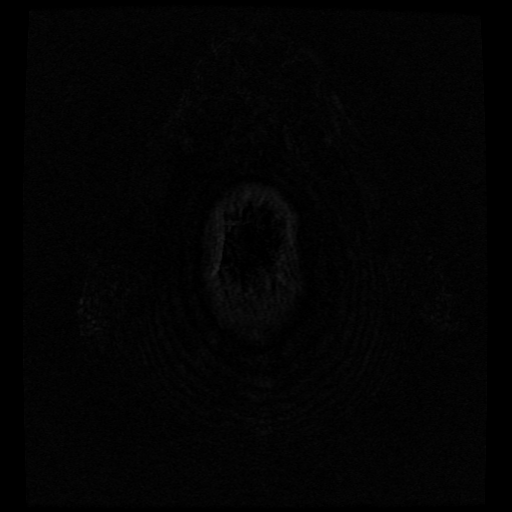

[Series 12: T1 post-contrast · coronal · 5.0mm · 0.39mm/px · 2 of 29 slices shown (2 of 3)]
[im 1/29]
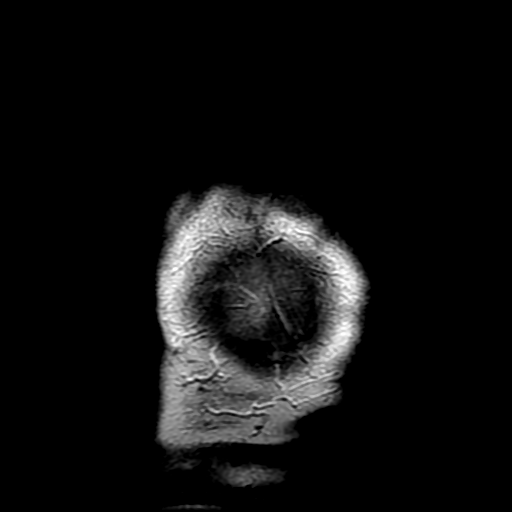
[im 29/29]
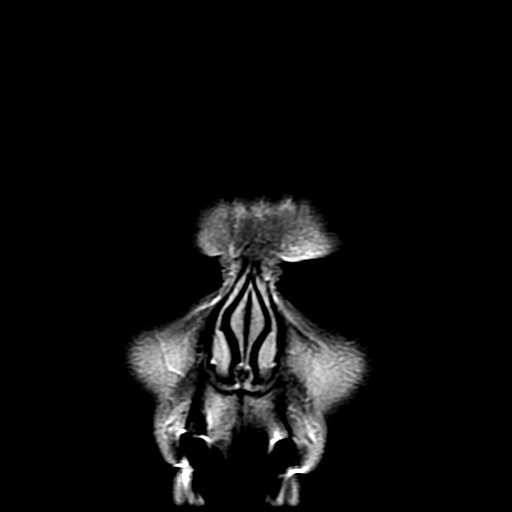

[Series 13: T1 post-contrast · sagittal · 5.0mm · 0.47mm/px · 2 of 27 slices shown (3 of 3)]
[im 1/27]
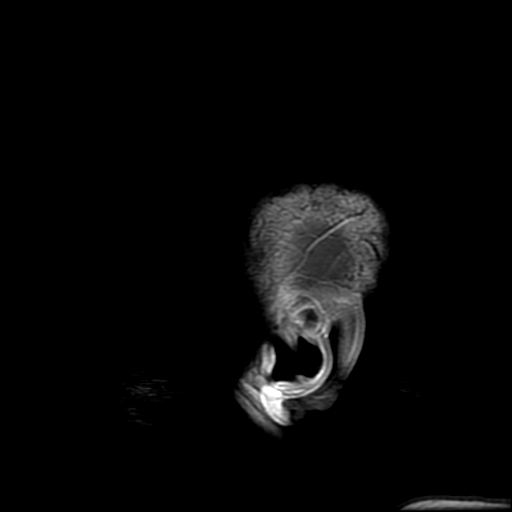
[im 27/27]
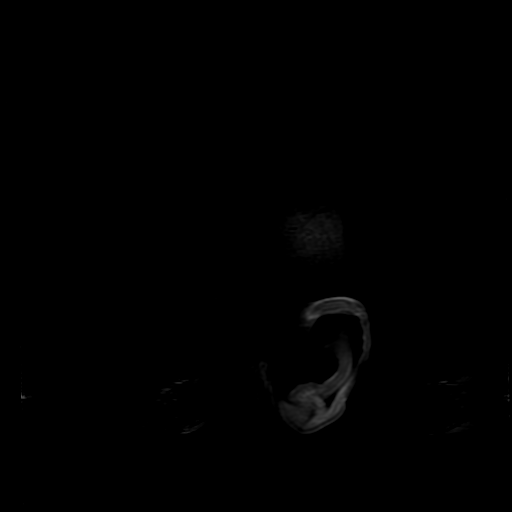

[Series 300: DWI · axial · 3.0mm · 1.09mm/px · z∈[-44,+108]mm · 4 of 53 slices shown (3 of 4)]
[im 1/53]
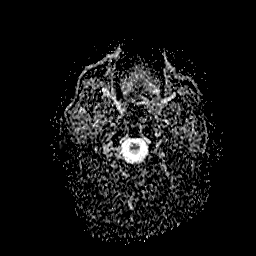
[im 18/53]
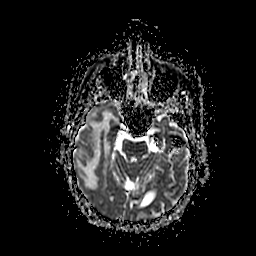
[im 35/53]
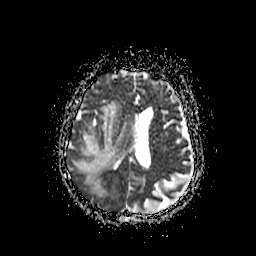
[im 53/53]
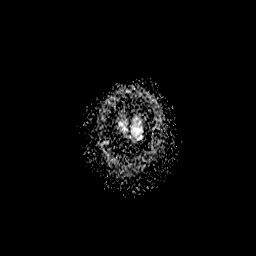

[Series 400: DWI · coronal · 5.0mm · 1.09mm/px · 3 of 38 slices shown (4 of 4)]
[im 1/38]
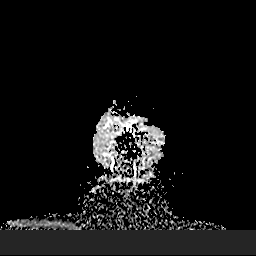
[im 19/38]
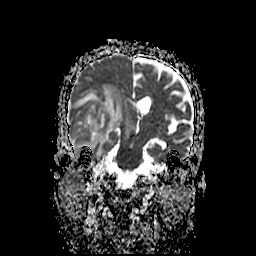
[im 38/38]
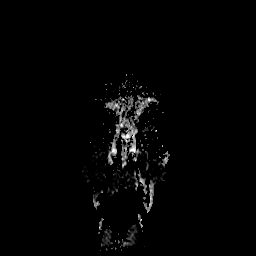

[32 of 48 positions shown; findings below may reference images not displayed]

FINDINGS: Brain: Examination mildly degraded by motion.

Cerebral volume within normal limits. No evidence for acute or
subacute infarct.

Large heterogeneous intra-axial mass centered at the right
frontotemporal region measures 6.9 x 5.7 x 5.2 cm (AP by
craniocaudad by transverse). Lesion demonstrates irregular and
fairly avid postcontrast enhancement. Scattered areas of internal
central necrosis, with minimal associated blood products. A second
but partially contiguous component extending towards the right
parietal lobe measures 3.4 x 2.5 x 2.3 cm (series 13, image 12).
Probable small amount of patchy enhancement noted extending along
the right cerebral peduncle into the right midbrain (series 11,
image 25). Additionally, lesion abuts the atrium of the right
lateral ventricle with question of possible ependymal
involvement/enhancement (series 11, image 28). Surrounding T2/FLAIR
signal abnormality consistent with vasogenic edema and/or
nonenhancing infiltrative tumor. Changes extend into the right
cerebral peduncle and midbrain. Right lateral ventricle is largely
effaced. Associated right-to-left shift measures up to 11 mm. No
hydrocephalus or trapping at this time. Basilar cisterns remain
patent.

No other mass lesion or abnormal enhancement. No extra-axial fluid
collection. Pituitary gland suprasellar region normal.

Vascular: Major intracranial vascular flow voids are maintained.

Skull and upper cervical spine: Craniocervical junction within
normal limits. Bone marrow signal intensity normal. No focal marrow
replacing lesion. No scalp soft tissue abnormality.

Sinuses/Orbits: Increased CSF signal intensity along the optic nerve
sheaths compatible with elevated intracranial pressures. Globes and
orbital soft tissues demonstrate no other acute finding. Scattered
mucosal thickening noted within the ethmoidal air cells. No mastoid
effusion.

Other: None.
IMPRESSION: Large heterogeneous enhancing intra-axial mass centered at the right
frontotemporal region as detailed above, most consistent with a
high-grade primary CNS neoplasm/GBM. Associated regional mass effect
with up to 11 mm of right-to-left shift. No hydrocephalus or
ventricular trapping at this time.

## 2021-02-23 MED ORDER — DEXAMETHASONE SODIUM PHOSPHATE 10 MG/ML IJ SOLN
6.0000 mg | Freq: Four times a day (QID) | INTRAMUSCULAR | Status: DC
  Administered 2021-02-23 – 2021-02-24 (×3): 6 mg via INTRAVENOUS
  Filled 2021-02-23 (×3): qty 1

## 2021-02-23 MED ORDER — ACETAMINOPHEN 325 MG PO TABS: 650.0000 mg | ORAL_TABLET | ORAL | Status: DC | PRN

## 2021-02-23 MED ORDER — IOHEXOL 350 MG/ML SOLN
100.0000 mL | Freq: Once | INTRAVENOUS | Status: AC | PRN
  Administered 2021-02-23: 100 mL via INTRAVENOUS

## 2021-02-23 MED ORDER — ACETAMINOPHEN 160 MG/5ML PO SOLN: 650.0000 mg | ORAL | Status: DC | PRN

## 2021-02-23 MED ORDER — DIAZEPAM 5 MG/ML IJ SOLN
5.0000 mg | Freq: Once | INTRAMUSCULAR | Status: AC | PRN
  Administered 2021-02-23: 5 mg via INTRAVENOUS
  Filled 2021-02-23 (×2): qty 2

## 2021-02-23 MED ORDER — GADOBUTROL 1 MMOL/ML IV SOLN
7.0000 mL | Freq: Once | INTRAVENOUS | Status: AC | PRN
  Administered 2021-02-23: 7 mL via INTRAVENOUS

## 2021-02-23 MED ORDER — ACETAMINOPHEN 650 MG RE SUPP: 650.0000 mg | RECTAL | Status: DC | PRN

## 2021-02-23 MED ORDER — STROKE: EARLY STAGES OF RECOVERY BOOK
Freq: Once | Status: AC
  Filled 2021-02-23: qty 1

## 2021-02-23 MED ORDER — SENNOSIDES-DOCUSATE SODIUM 8.6-50 MG PO TABS: 1.0000 | ORAL_TABLET | Freq: Every evening | ORAL | Status: DC | PRN

## 2021-02-23 MED ORDER — SODIUM CHLORIDE 3 % IV SOLN
INTRAVENOUS | Status: DC
  Filled 2021-02-23 (×3): qty 500

## 2021-02-23 NOTE — Progress Notes (Signed)
I have discussed with the family over the telephone, advised him to go to the emergency room for immediate evaluation and management.  Risk of brain herniation also discussed.

## 2021-02-23 NOTE — Progress Notes (Signed)
Patient ID: Dennis Fry, male   DOB: 02/15/1955, 66 y.o.   MRN: [id number] Films reviewed. MRI brain with contrast is key, current mri is limited in its usefulness given lack of contrast and movement.  Chest abdomen and pelvis results noted.  Their is extensive involvement of the right temporal, basal ganglia, parietal , and frontal lobes.  Await redo mri.

## 2021-02-23 NOTE — ED Provider Triage Note (Signed)
Emergency Medicine Provider Triage Evaluation Note  Dennis Fry , a 66 y.o. male  was evaluated in triage.  Pt complains of left sided weakness for 5 days. Language barrier present, history provided by cousin at bedside. Cousin reports patient has had left sided weakness upper>lower for the last 5 days. Was seen at cardiology earlier today and had abnormal CT results, sent here for further workup. Patient has no slurring of words, unable to smile for provider in triage.  Review of Systems  Positive: Left sided weakness Negative: Cp, SOB  Physical Exam  BP 128/70 (BP Location: Left Arm)    Pulse 69    Temp 98.1 F (36.7 C) (Oral)    Resp 16    Ht 5\' 5"  (1.651 m)    Wt 71.2 kg    SpO2 99%    BMI 26.13 kg/m  Gen:   Awake, no distress   Resp:  Normal effort  MSK:   Moves extremities without difficulty  Other:  Patient has 1/5 strength to LUE  Medical Decision Making  Medically screening exam initiated at 12:32 PM.  Appropriate orders placed.  Dennis Fry was informed that the remainder of the evaluation will be completed by another provider, this initial triage assessment does not replace that evaluation, and the importance of remaining in the ED until their evaluation is complete.     Dennis Cecil, PA-C 02/23/21 1233

## 2021-02-23 NOTE — Progress Notes (Signed)
°  Echocardiogram 2D Echocardiogram has been performed.  Dennis Fry 02/23/2021, 4:00 PM

## 2021-02-23 NOTE — H&P (Addendum)
H and P  Reason for Consult: left arm and leg weakness Referring Physician: Dr. Helane Gunther  CC: left arm and leg wekaness  History is obtained from:patient, family and chart  HPI: Dennis Fry is a 66 y.o. male with a history of atrial fibrillation on apixaban, HLD, DM and CAD with LAD stenting 3 months ago.  He recently arrived to the [place] from [place] to visit family on 1/29.  Patient's family states that he had some left hand weakness on arrival which has now worsened.  Patient also developed left leg weakness with gait disturbances as well as a headache.  He saw a cardiologist on 2/6 and was directed to have a head CT scan, which he underwent today.  CT revealed extensive subcortical left sided white matter low density with 27mm left to right midline shift as well as hyperdensity in right temporal horn likely representing choroid plexus.     LKW: 1/29 TNK given?: no, outside of window IR Thrombectomy? No, outside of window Modified Rankin Scale: 0-Completely asymptomatic and back to baseline post- stroke  ROS: A complete ROS was performed and is negative except as noted in the HPI.    No past medical history on file. History of DM, CAD with recent LAD stenting, atrial fibrillation  Family History  Problem Relation Age of Onset   Heart attack Father 86   Heart attack Sister 69   Heart attack Sister 8   Lung cancer Sister    Heart attack Brother      Social History:   reports that he has never smoked. He has never used smokeless tobacco. He reports that he does not drink alcohol and does not use drugs.  Medications  Current Facility-Administered Medications:     stroke: mapping our early stages of recovery book, , Does not apply, Once, de La Torre, Cortney E, NP   acetaminophen (TYLENOL) tablet 650 mg, 650 mg, Oral, Q4H PRN **OR** acetaminophen (TYLENOL) 160 MG/5ML solution 650 mg, 650 mg, Per Tube, Q4H PRN **OR** acetaminophen (TYLENOL) suppository 650 mg, 650 mg, Rectal,  Q4H PRN, de Yolanda Manges, Cortney E, NP   dexamethasone (DECADRON) injection 6 mg, 6 mg, Intravenous, Q6H, Jenee Spaugh M, MD   diazepam (VALIUM) injection 5 mg, 5 mg, Intravenous, Once PRN, de Yolanda Manges, Cortney E, NP   senna-docusate (Senokot-S) tablet 1 tablet, 1 tablet, Oral, QHS PRN, de Yolanda Manges, Cortney E, NP   sodium chloride (hypertonic) 3 % solution, , Intravenous, Continuous, de La Mervyn Gay, [place] E, NP, Last Rate: 50 mL/hr at 02/23/21 1723, New Bag at 02/23/21 1723  Current Outpatient Medications:    apixaban (ELIQUIS) 5 MG TABS tablet, Take 5 mg by mouth 2 (two) times daily., Disp: , Rfl:    atorvastatin (LIPITOR) 40 MG tablet, Take 40 mg by mouth daily., Disp: , Rfl:    bisoprolol (ZEBETA) 5 MG tablet, Take 2.5 mg by mouth daily., Disp: , Rfl:    clopidogrel (PLAVIX) 75 MG tablet, Take 75 mg by mouth daily., Disp: , Rfl:    empagliflozin (JARDIANCE) 25 MG TABS tablet, Take 12.5 mg by mouth daily., Disp: , Rfl:    esomeprazole (NEXIUM) 20 MG capsule, Take 20 mg by mouth daily at 12 noon., Disp: , Rfl:    glipiZIDE (GLUCOTROL) 10 MG tablet, Take 60 mg by mouth daily before breakfast., Disp: , Rfl:    sitaGLIPtin-metformin (JANUMET) 50-1000 MG tablet, Take 1 tablet by mouth 2 (two) times daily with a meal., Disp: , Rfl:  Clopidogrel 75 mg daily Gliclazide 60 mg daily  Exam: Current vital signs: BP 128/69    Pulse 71    Temp 98.1 F (36.7 C) (Oral)    Resp 20    Ht 5\' 5"  (1.651 m)    Wt 71.2 kg    SpO2 98%    BMI 26.13 kg/m  Vital signs in last 24 hours: Temp:  [98.1 F (36.7 C)] 98.1 F (36.7 C) (02/08 1150) Pulse Rate:  [65-71] 71 (02/08 1900) Resp:  [16-23] 20 (02/08 1900) BP: (119-133)/(64-74) 128/69 (02/08 1900) SpO2:  [95 %-99 %] 98 % (02/08 1900) Weight:  [71.2 kg] 71.2 kg (02/08 1155)  GENERAL: Awake, alert, in no acute distress Psych: Affect appropriate for situation, patient is calm and cooperative with examination Head: Normocephalic and atraumatic, without obvious  abnormality EENT: Normal conjunctivae, dry mucous membranes, no OP obstruction LUNGS: Normal respiratory effort. Non-labored breathing on room air CV: Regular rate and rhythm on telemetry Extremities: warm, well perfused, without obvious deformity  NEURO:  Mental Status: Awake, alert, and oriented to person, place, time, and situation. He is able to provide a clear and coherent history of present illness. Speech/Language: speech is fluent and normal per family members.   Naming, fluency, and comprehension intact without aphasia  No neglect is noted Cranial Nerves:  II: PERRL 3 mm/brisk. visual fields full.  III, IV, VI: EOMI. Lid elevation symmetric and full.  V: Sensation is intact to light touch and symmetrical to face.  VII: Face is symmetric resting and smiling. Able to puff cheeks and raise eyebrows.  VIII: Hearing intact to voice IX, X: Phonation normal.  XII: Tongue protrudes midline without fasciculations.   Motor: 5/5 strength in RUE and RLE.  4/5 strength in LUE with inability to move hand.  4+/5 strength in LLE  Sensation: Intact to light touch bilaterally in all four extremities. No extinction to DSS present.  Coordination: FTN intact bilaterally with weakness noted on left. Left arm drift present Gait: Deferred  NIHSS: 1a Level of Conscious.: 0 1b LOC Questions: 0 1c LOC Commands: 0 2 Best Gaze: 0 3 Visual: 0 4 Facial Palsy: 0 5a Motor Arm - left: 2 5b Motor Arm - Right: 0 6a Motor Leg - Left: 0 6b Motor Leg - Right: 0 7 Limb Ataxia: 1 8 Sensory: 0 9 Best Language: 0 10 Dysarthria: 0 11 Extinct. and Inatten.: 0 TOTAL: 3   Labs I have reviewed labs in epic and the results pertinent to this consultation are:   CBC    Component Value Date/Time   WBC 13.4 (H) 02/23/2021 1418   RBC 6.32 (H) 02/23/2021 1418   HGB 16.0 02/23/2021 1443   HGB 14.2 02/21/2021 1409   HCT 47.0 02/23/2021 1443   HCT 44.6 02/21/2021 1409   PLT 294 02/23/2021 1418   PLT 318  02/21/2021 1409   MCV 72.2 (L) 02/23/2021 1418   MCV 72 (L) 02/21/2021 1409   MCH 22.5 (L) 02/23/2021 1418   MCHC 31.1 02/23/2021 1418   RDW 14.7 02/23/2021 1418   RDW 14.6 02/21/2021 1409   LYMPHSABS 3.5 02/23/2021 1418   MONOABS 0.9 02/23/2021 1418   EOSABS 0.3 02/23/2021 1418   BASOSABS 0.0 02/23/2021 1418    CMP     Component Value Date/Time   NA 132 (L) 02/23/2021 1618   NA 136 02/21/2021 1409   K 3.8 02/23/2021 1443   CL 100 02/23/2021 1443   CO2 20 (L) 02/23/2021 1418  GLUCOSE 93 02/23/2021 1443   BUN 22 02/23/2021 1443   BUN 19 02/21/2021 1409   CREATININE 0.70 02/23/2021 1443   CALCIUM 8.9 02/23/2021 1418   PROT 7.7 02/23/2021 1418   PROT 7.9 02/21/2021 1409   ALBUMIN 3.7 02/23/2021 1418   ALBUMIN 4.4 02/21/2021 1409   AST 19 02/23/2021 1418   ALT 16 02/23/2021 1418   ALKPHOS 86 02/23/2021 1418   BILITOT 1.0 02/23/2021 1418   BILITOT 0.4 02/21/2021 1409   GFRNONAA >60 02/23/2021 1418    Lipid Panel     Component Value Date/Time   CHOL 107 02/23/2021 1459   TRIG 107 02/23/2021 1459   HDL 29 (L) 02/23/2021 1459   CHOLHDL 3.7 02/23/2021 1459   VLDL 21 02/23/2021 1459   LDLCALC 57 02/23/2021 1459     Imaging I have reviewed the images obtained:  CT-scan of the brain extensive subcortical white matter hypodensity with 11 mm left to right midline shift and hyperdensity in right temporal horn  CTA- pending  MRI examination of the brain- pending  Assessment: Patient with history of atrial fibrillation on Eliquis has had a stroke sometime after his arrival to the [place] on 1/29.  As his last known well time was prior to 1/29, he is outside the window for TNK and mechanical thrombectomy.  Significant cerebral edema is seen on initial CT scan.  Will monitor patient closely with frequent neuro checks.  Low threshold to rescan if exam deteriorates.  Will plan for admission to ICU and initiation of hypertonic saline with Na goal of 145-150 to decrease cerebral  edema.  Will start aspirin 81 mg daily but will hold off on other anticoagulation for now given risk of hemorrhagic transformation.   Recommendations: - Admit to ICU - 3% hypertonic saline at 50cc/hr with Na goal of 145-150 - Monitor closely with frequent neuro exams - Continue stroke workup with A1C, lipid panel, MRI, echocardiogram and CTA - PT/OT/SLP evaluation - ASA 81 mg daily, hold off on other anticoagulation for now  Pt seen by NP/Neuro and later by MD. Note/plan to be edited by MD as needed.  Dorene Grebe , MSN, AGACNP-BC [facility] See Amion for schedule and pager information 02/23/2021 7:13 PM  ATTENDING ATTESTATION:  Pt with left side weakness since getting off the place from [place] about a week ago. Seen by Dr. Einar Gip outpt, CT showed possible CVA with shift, started on hypertonic saline. MRI concerning for malignancy. Contacted neurosurgery. Chest/abd/pelvis CT ordered. Hypertonic stopped, started on decadron 6mg  q 6hrs per Neurosurgery recommendations. Will need MRI with contrast.   Dr. Reeves Forth evaluated pt independently, reviewed imaging, chart, labs. Discussed and formulated plan with the APP. Please see APP note above for details.   75 mins spent in total with this pt. Reviewing his CT, MRI scans. Discussing with ED.  Will sign out to on call Neurologist, Dr. Cheral Marker.   Dennis Diveley,MD

## 2021-02-23 NOTE — Consult Note (Deleted)
Neurology Consultation  Reason for Consult: left arm and leg weakness Referring Physician: Dr. Helane Gunther  CC: left arm and leg wekaness  History is obtained from:patient, family and chart  HPI: Rochelle Nephew is a 66 y.o. male with a history of atrial fibrillation on apixaban, HLD, DM and CAD with LAD stenting 3 months ago.  He recently arrived to the [place] from [place] to visit family on 1/29.  Patient's family states that he had some left hand weakness on arrival which has now worsened.  Patient also developed left leg weakness with gait disturbances as well as a headache.  He saw a cardiologist on 2/6 and was directed to have a head CT scan, which he underwent today.  CT revealed extensive subcortical left sided white matter low density with 54mm left to right midline shift as well as hyperdensity in right temporal horn likely representing choroid plexus.     LKW: 1/29 TNK given?: no, outside of window IR Thrombectomy? No, outside of window Modified Rankin Scale: 0-Completely asymptomatic and back to baseline post- stroke  ROS: A complete ROS was performed and is negative except as noted in the HPI.    No past medical history on file. History of DM, CAD with recent LAD stenting, atrial fibrillation  Family History  Problem Relation Age of Onset   Heart attack Father 52   Heart attack Sister 41   Heart attack Sister 4   Lung cancer Sister    Heart attack Brother      Social History:   reports that he has never smoked. He has never used smokeless tobacco. He reports that he does not drink alcohol and does not use drugs.  Medications No current facility-administered medications for this encounter.  Current Outpatient Medications:    apixaban (ELIQUIS) 5 MG TABS tablet, Take 5 mg by mouth 2 (two) times daily., Disp: , Rfl:    aspirin EC 81 MG tablet, Take 81 mg by mouth daily. Swallow whole., Disp: , Rfl:    atorvastatin (LIPITOR) 40 MG tablet, Take 40 mg by mouth daily., Disp:  , Rfl:    bisoprolol (ZEBETA) 5 MG tablet, Take 2.5 mg by mouth daily., Disp: , Rfl:    empagliflozin (JARDIANCE) 25 MG TABS tablet, Take by mouth daily., Disp: , Rfl:    esomeprazole (NEXIUM) 20 MG capsule, Take 20 mg by mouth daily at 12 noon., Disp: , Rfl:    glipiZIDE (GLUCOTROL) 10 MG tablet, Take 60 mg by mouth daily before breakfast., Disp: , Rfl:    sitaGLIPtin-metformin (JANUMET) 50-1000 MG tablet, Take 1 tablet by mouth 2 (two) times daily with a meal., Disp: , Rfl:  Clopidogrel 75 mg daily Gliclazide 60 mg daily  Exam: Current vital signs: BP 129/72    Pulse 68    Temp 98.1 F (36.7 C) (Oral)    Resp 16    Ht 5\' 5"  (1.651 m)    Wt 71.2 kg    SpO2 96%    BMI 26.13 kg/m  Vital signs in last 24 hours: Temp:  [98.1 F (36.7 C)] 98.1 F (36.7 C) (02/08 1150) Pulse Rate:  [68-69] 68 (02/08 1427) Resp:  [16] 16 (02/08 1426) BP: (128-129)/(70-72) 129/72 (02/08 1426) SpO2:  [96 %-99 %] 96 % (02/08 1427) Weight:  [71.2 kg] 71.2 kg (02/08 1155)  GENERAL: Awake, alert, in no acute distress Psych: Affect appropriate for situation, patient is calm and cooperative with examination Head: Normocephalic and atraumatic, without obvious abnormality EENT: Normal conjunctivae, dry  mucous membranes, no OP obstruction LUNGS: Normal respiratory effort. Non-labored breathing on room air CV: Regular rate and rhythm on telemetry Extremities: warm, well perfused, without obvious deformity  NEURO:  Mental Status: Awake, alert, and oriented to person, place, time, and situation. He is able to provide a clear and coherent history of present illness. Speech/Language: speech is fluent and normal per family members.   Naming, fluency, and comprehension intact without aphasia  No neglect is noted Cranial Nerves:  II: PERRL 3 mm/brisk. visual fields full.  III, IV, VI: EOMI. Lid elevation symmetric and full.  V: Sensation is intact to light touch and symmetrical to face.  VII: Face is symmetric  resting and smiling. Able to puff cheeks and raise eyebrows.  VIII: Hearing intact to voice IX, X: Phonation normal.  XII: Tongue protrudes midline without fasciculations.   Motor: 5/5 strength in RUE and RLE.  4/5 strength in LUE with inability to move hand.  4+/5 strength in LLE  Sensation: Intact to light touch bilaterally in all four extremities. No extinction to DSS present.  Coordination: FTN intact bilaterally with weakness noted on left. Left arm drift present Gait: Deferred  NIHSS: 1a Level of Conscious.: 0 1b LOC Questions: 0 1c LOC Commands: 0 2 Best Gaze: 0 3 Visual: 0 4 Facial Palsy: 0 5a Motor Arm - left: 2 5b Motor Arm - Right: 0 6a Motor Leg - Left: 0 6b Motor Leg - Right: 0 7 Limb Ataxia: 1 8 Sensory: 0 9 Best Language: 0 10 Dysarthria: 0 11 Extinct. and Inatten.: 0 TOTAL: 3   Labs I have reviewed labs in epic and the results pertinent to this consultation are:   CBC    Component Value Date/Time   WBC 13.4 (H) 02/23/2021 1418   RBC 6.32 (H) 02/23/2021 1418   HGB 16.0 02/23/2021 1443   HGB 14.2 02/21/2021 1409   HCT 47.0 02/23/2021 1443   HCT 44.6 02/21/2021 1409   PLT 294 02/23/2021 1418   PLT 318 02/21/2021 1409   MCV 72.2 (L) 02/23/2021 1418   MCV 72 (L) 02/21/2021 1409   MCH 22.5 (L) 02/23/2021 1418   MCHC 31.1 02/23/2021 1418   RDW 14.7 02/23/2021 1418   RDW 14.6 02/21/2021 1409   LYMPHSABS 3.5 02/23/2021 1418   MONOABS 0.9 02/23/2021 1418   EOSABS 0.3 02/23/2021 1418   BASOSABS 0.0 02/23/2021 1418    CMP     Component Value Date/Time   NA 132 (L) 02/23/2021 1443   NA 136 02/21/2021 1409   K 3.8 02/23/2021 1443   CL 100 02/23/2021 1443   CO2 20 (L) 02/23/2021 1418   GLUCOSE 93 02/23/2021 1443   BUN 22 02/23/2021 1443   BUN 19 02/21/2021 1409   CREATININE 0.70 02/23/2021 1443   CALCIUM 8.9 02/23/2021 1418   PROT 7.7 02/23/2021 1418   PROT 7.9 02/21/2021 1409   ALBUMIN 3.7 02/23/2021 1418   ALBUMIN 4.4 02/21/2021 1409   AST 19  02/23/2021 1418   ALT 16 02/23/2021 1418   ALKPHOS 86 02/23/2021 1418   BILITOT 1.0 02/23/2021 1418   BILITOT 0.4 02/21/2021 1409   GFRNONAA >60 02/23/2021 1418    Lipid Panel  No results found for: CHOL, TRIG, HDL, CHOLHDL, VLDL, LDLCALC, LDLDIRECT   Imaging I have reviewed the images obtained:  CT-scan of the brain extensive subcortical white matter hypodensity with 11 mm left to right midline shift and hyperdensity in right temporal horn  CTA- pending  MRI examination  of the brain- pending  Assessment: Patient with history of atrial fibrillation on Eliquis has had a stroke sometime after his arrival to the [place] on 1/29.  As his last known well time was prior to 1/29, he is outside the window for TNK and mechanical thrombectomy.  Significant cerebral edema is seen on initial CT scan.  Will monitor patient closely with frequent neuro checks.  Low threshold to rescan if exam deteriorates.  Will plan for admission to ICU and initiation of hypertonic saline with Na goal of 145-150 to decrease cerebral edema.  Will start aspirin 81 mg daily but will hold off on other anticoagulation for now given risk of hemorrhagic transformation.   Recommendations: - Admit to ICU - 3% hypertonic saline at 50cc/hr with Na goal of 145-150 - Monitor closely with frequent neuro exams - Continue stroke workup with A1C, lipid panel, MRI, echocardiogram and CTA - PT/OT/SLP evaluation - ASA 81 mg daily, hold off on other anticoagulation for now  Pt seen by NP/Neuro and later by MD. Note/plan to be edited by MD as needed.  [place] , MSN, AGACNP-BC [facility] See Amion for schedule and pager information 02/23/2021 3:52 PM  ATTENDING ATTESTATION:  Pt with left side weakness since getting off the place from [place] about a week ago. Seen by Dr. Einar Gip outpt, CT showed possible CVA with shift, started on hypertonic saline. MRI concerning for malignancy. Contacted neurosurgery.  Chest/abd/pelvis CT ordered. Hypertonic stopped, started on decadron 6mg  q 6hrs per Neurosurgery recommendations. Will need MRI with contrast.   Dr. Reeves Forth evaluated pt independently, reviewed imaging, chart, labs. Discussed and formulated plan with the APP. Please see APP note above for details.   75 mins spent in total with this pt. Reviewing his CT, MRI scans. Discussing with ED.  Will sign out to on call Neurologist, Dr. Cheral Marker.   Anoushka Divito,MD

## 2021-02-23 NOTE — ED Provider Notes (Signed)
°  Physical Exam  BP 127/74    Pulse 65    Temp 98.1 F (36.7 C) (Oral)    Resp 20    Ht 5\' 5"  (1.651 m)    Wt 71.2 kg    SpO2 95%    BMI 26.13 kg/m     Procedures  Procedures  ED Course / MDM    Medical Decision Making Amount and/or Complexity of Data Reviewed Labs: ordered.  Risk Decision regarding hospitalization.   66M, presenting with concern for CVA 5d ago. Hx of afib on Eliquis, visiting from [place] as of 29th of Jan. LUE weakness. R MCA infarct on CT done outpatient today, some edema with midline shift. Neuro consulted. MRI pending. Neuro said no Decadron for now.  Patient subsequently admitted in stable condition.       Regan Lemming, MD 02/23/21 [phone]

## 2021-02-23 NOTE — ED Triage Notes (Signed)
Reported was told by cardiologist for a work up d/t "increased fluid in his brain"; reported a CT scan ws done earlier today around 920 and had some abnormal results. Patient's first cousin Payton Doughty, at bedside providing information. Stated left sided weakness for about 5 days now.

## 2021-02-23 NOTE — ED Notes (Signed)
Pt transported to MRI 

## 2021-02-23 NOTE — ED Provider Notes (Addendum)
[facility] EMERGENCY DEPARTMENT Provider Note   CSN: [id number] Arrival date & time: 02/23/21  1125     History  Chief Complaint  Patient presents with   Weakness    Dennis Fry is a 66 y.o. male.  Patient visiting from [place].  Got here on January 29.  Patient has a history that is significant for coronary artery disease atrial fibrillation hyperlipidemia diabetes had a coronary stent done 3 months ago in the LAD.  Patient about 5 days ago started to have left-sided weakness left upper extremity greater than left lower extremity.  Saw Dr. Einar Gip in the office on February 6.  And he ordered a CT scan based on the weakness in the left arm.  CT scan was done today that had significant abnormalities patient referred in here appropriately by him.  Patient is on Eliquis.  And patient is on Zebeta.  Patient denies any visual changes.  No speech problems.  Patient does complain of head pressure.  Patient is able to ambulate.  But does not walk normally.      Home Medications Prior to Admission medications   Medication Sig Start Date End Date Taking? Authorizing Provider  apixaban (ELIQUIS) 5 MG TABS tablet Take 5 mg by mouth 2 (two) times daily.    [provider]  aspirin EC 81 MG tablet Take 81 mg by mouth daily. Swallow whole.    [provider]  atorvastatin (LIPITOR) 40 MG tablet Take 40 mg by mouth daily.    [provider]  bisoprolol (ZEBETA) 5 MG tablet Take 2.5 mg by mouth daily.    [provider]  empagliflozin (JARDIANCE) 25 MG TABS tablet Take by mouth daily.    [provider]  esomeprazole (NEXIUM) 20 MG capsule Take 20 mg by mouth daily at 12 noon.    [provider]  glipiZIDE (GLUCOTROL) 10 MG tablet Take 60 mg by mouth daily before breakfast.    [provider]  sitaGLIPtin-metformin (JANUMET) 50-1000 MG tablet Take 1 tablet by mouth 2 (two) times daily with a meal.    [provider]      Allergies    Patient has no known allergies.    Review of Systems   Review of Systems  Constitutional:  Negative for chills and fever.  HENT:  Negative for ear pain and sore throat.   Eyes:  Negative for pain and visual disturbance.  Respiratory:  Negative for cough and shortness of breath.   Cardiovascular:  Negative for chest pain and palpitations.  Gastrointestinal:  Negative for abdominal pain and vomiting.  Genitourinary:  Negative for dysuria and hematuria.  Musculoskeletal:  Negative for arthralgias and back pain.  Skin:  Negative for color change and rash.  Neurological:  Positive for weakness. Negative for seizures and syncope.  All other systems reviewed and are negative.  Physical Exam Updated Vital Signs BP 128/70 (BP Location: Left Arm)    Pulse 69    Temp 98.1 F (36.7 C) (Oral)    Resp 16    Ht 1.651 m (5\' 5" )    Wt 71.2 kg    SpO2 99%    BMI 26.13 kg/m  Physical Exam Vitals and nursing note reviewed.  Constitutional:      General: He is not in acute distress.    Appearance: Normal appearance. He is well-developed.  HENT:     Head: Normocephalic and atraumatic.  Eyes:     Extraocular Movements: Extraocular movements  intact.     Conjunctiva/sclera: Conjunctivae normal.  Cardiovascular:     Rate and Rhythm: Normal rate and regular rhythm.     Heart sounds: No murmur heard. Pulmonary:     Effort: Pulmonary effort is normal. No respiratory distress.     Breath sounds: Normal breath sounds.  Abdominal:     Palpations: Abdomen is soft.     Tenderness: There is no abdominal tenderness.  Musculoskeletal:        General: No swelling.     Cervical back: Neck supple.  Skin:    General: Skin is warm and dry.     Capillary Refill: Capillary refill takes less than 2 seconds.  Neurological:     Mental Status: He is alert and oriented to person, place, and time.     Cranial Nerves: No cranial nerve deficit.     Sensory: No sensory deficit.      Motor: Weakness present.     Comments: Marked weakness of left upper extremity.  Greater than left lower extremity.  Right side normal.  Psychiatric:        Mood and Affect: Mood normal.    ED Results / Procedures / Treatments   Labs (all labs ordered are listed, but only abnormal results are displayed) Labs Reviewed  RESP PANEL BY RT-PCR (FLU A&B, COVID) ARPGX2  ETHANOL  PROTIME-INR  APTT  CBC  DIFFERENTIAL  COMPREHENSIVE METABOLIC PANEL  RAPID URINE DRUG SCREEN, HOSP PERFORMED  URINALYSIS, ROUTINE W REFLEX MICROSCOPIC  I-STAT CHEM 8, ED    EKG EKG Interpretation  Date/Time:  Wednesday February 23 2021 11:52:31 EST Ventricular Rate:  67 PR Interval:  158 QRS Duration: 82 QT Interval:  418 QTC Calculation: 441 R Axis:   13 Text Interpretation: Normal sinus rhythm Minimal voltage criteria for LVH, may be normal variant ( R in aVL ) Borderline ECG No previous ECGs available No previous ECGs available Confirmed by Fredia Sorrow [phone]) on 02/23/2021 2:00:11 PM  Radiology CT HEAD WO CONTRAST (5MM)  Result Date: 02/23/2021 CLINICAL DATA:  Gait instability. EXAM: CT HEAD WITHOUT CONTRAST TECHNIQUE: Contiguous axial images were obtained from the base of the skull through the vertex without intravenous contrast. RADIATION DOSE REDUCTION: This exam was performed according to the departmental dose-optimization program which includes automated exposure control, adjustment of the mA and/or kV according to patient size and/or use of iterative reconstruction technique. COMPARISON:  None. FINDINGS: Brain: There is extensive subcortical low density consistent with edema resulting in significant mass effect in midline shift. There is approximately 11 mm of right to left midline shift. Compression of the right lateral ventricle is noted. High density material is noted in the right temporal horn which may represent the choroid plexus which is accentuated due to the surrounding low density, although  possible intra ventricular hemorrhage cannot be excluded. Prominence of the cortical gyri of the right temporal and parietal lobes is also noted which most likely is essential weighted due to the surrounding low density, although underlying mass or possible hemorrhage cannot be excluded. Vascular: Relative high density is noted in the right MCA which may be due to extend to a shin due to the surrounding low density, although thrombus cannot be excluded. Skull: Normal. Negative for fracture or focal lesion. Sinuses/Orbits: No acute finding. Other: None. IMPRESSION: Extensive probable subcortical white matter low density is noted resulting in significant mass effect and 11 mm of right to left midline shift. Hyperdensity is noted in right temporal horn which most likely  represents choroid plexus accentuated due to surrounding low density, but intraventricular hemorrhage cannot be excluded. Prominent and thickened cortical gyri are noted in right temporal and parietal cortices which are more prominent most likely due to surrounding low density, but hemorrhage cannot be excluded. Possibility of right MCA thrombus cannot be excluded. Further evaluation with MRI is recommended. Critical Value/emergent results were called by telephone at the time of interpretation on 02/23/2021 at 10:19 am to provider Adrian Prows , who verbally acknowledged these results. Electronically Signed   By: Marijo Conception M.D.   On: 02/23/2021 10:20    Procedures Procedures  EKG cardiac monitor here without atrial fibrillation.  Medications Ordered in ED Medications - No data to display  ED Course/ Medical Decision Making/ A&P                           Medical Decision Making Amount and/or Complexity of Data Reviewed Labs: ordered.   CRITICAL CARE Performed by: Fredia Sorrow Total critical care time: 60 minutes Critical care time was exclusive of separately billable procedures and treating other patients. Critical care was  necessary to treat or prevent imminent or life-threatening deterioration. Critical care was time spent personally by me on the following activities: development of treatment plan with patient and/or surrogate as well as nursing, discussions with consultants, evaluation of patient's response to treatment, examination of patient, obtaining history from patient or surrogate, ordering and performing treatments and interventions, ordering and review of laboratory studies, ordering and review of radiographic studies, pulse oximetry and re-evaluation of patient's condition.   CT scan of the head with significant abnormalities.  Including mass effect.  May very well have evidence of a right MCA stroke.  We will discuss with neurology.  See whether they want to do MRI or whether they want to do CTA next.  Patient since they have an 11 mm shift may have also may require Decadron I will talk to them about that as well.  Patient's onset of symptoms were about 5 days ago.  So out of the window for any acute intervention.  Plus patient is on Eliquis as well.  However will contact neurology as stated above.  Code stroke order set done without activation of code stroke.  Discussed with on-call neuro hospitalist.  Recommending MRI.  Which was ordered by the screening PA in triage.  So we will keep that going.  Did not recommend Decadron at this time.  They will see the patient in consultation.   Final Clinical Impression(s) / ED Diagnoses Final diagnoses:  Cerebrovascular accident (CVA), unspecified mechanism [facility])    Rx / DC Orders ED Discharge Orders     None         Fredia Sorrow, MD 02/23/21 1407    Fredia Sorrow, MD 02/23/21 1446

## 2021-02-24 DIAGNOSIS — I480 Paroxysmal atrial fibrillation: Secondary | ICD-10-CM

## 2021-02-24 DIAGNOSIS — I25119 Atherosclerotic heart disease of native coronary artery with unspecified angina pectoris: Secondary | ICD-10-CM

## 2021-02-24 DIAGNOSIS — C719 Malignant neoplasm of brain, unspecified: Principal | ICD-10-CM

## 2021-02-24 LAB — CBC
HCT: 42.8 % (ref 39.0–52.0)
Hemoglobin: 13.6 g/dL (ref 13.0–17.0)
MCH: 23.1 pg — ABNORMAL LOW (ref 26.0–34.0)
MCHC: 31.8 g/dL (ref 30.0–36.0)
MCV: 72.5 fL — ABNORMAL LOW (ref 80.0–100.0)
Platelets: 320 10*3/uL (ref 150–400)
RBC: 5.9 MIL/uL — ABNORMAL HIGH (ref 4.22–5.81)
RDW: 14.6 % (ref 11.5–15.5)
WBC: 11.9 10*3/uL — ABNORMAL HIGH (ref 4.0–10.5)
nRBC: 0 % (ref 0.0–0.2)

## 2021-02-24 LAB — COMPREHENSIVE METABOLIC PANEL
ALT: 15 U/L (ref 0–44)
AST: 19 U/L (ref 15–41)
Albumin: 3.4 g/dL — ABNORMAL LOW (ref 3.5–5.0)
Alkaline Phosphatase: 76 U/L (ref 38–126)
Anion gap: 11 (ref 5–15)
BUN: 20 mg/dL (ref 8–23)
CO2: 20 mmol/L — ABNORMAL LOW (ref 22–32)
Calcium: 8.8 mg/dL — ABNORMAL LOW (ref 8.9–10.3)
Chloride: 105 mmol/L (ref 98–111)
Creatinine, Ser: 0.87 mg/dL (ref 0.61–1.24)
GFR, Estimated: >60 mL/min (ref >60)
Glucose, Bld: 110 mg/dL — ABNORMAL HIGH (ref 70–99)
Potassium: 4.2 mmol/L (ref 3.5–5.1)
Sodium: 136 mmol/L (ref 135–145)
Total Bilirubin: 1 mg/dL (ref 0.3–1.2)
Total Protein: 7.2 g/dL (ref 6.5–8.1)

## 2021-02-24 LAB — HIV ANTIBODY (ROUTINE TESTING W REFLEX): HIV Screen 4th Generation wRfx: NONREACTIVE

## 2021-02-24 LAB — URINALYSIS, ROUTINE W REFLEX MICROSCOPIC
Bacteria, UA: NONE SEEN
Bilirubin Urine: NEGATIVE
Glucose, UA: >=500 mg/dL — AB
Hgb urine dipstick: NEGATIVE
Ketones, ur: 20 mg/dL — AB
Leukocytes,Ua: NEGATIVE
Nitrite: NEGATIVE
Protein, ur: NEGATIVE mg/dL
Specific Gravity, Urine: 1.04 — ABNORMAL HIGH (ref 1.005–1.030)
pH: 5 (ref 5.0–8.0)

## 2021-02-24 LAB — MRSA NEXT GEN BY PCR, NASAL: MRSA by PCR Next Gen: NOT DETECTED

## 2021-02-24 MED ORDER — LEVETIRACETAM 500 MG PO TABS
500.0000 mg | ORAL_TABLET | Freq: Two times a day (BID) | ORAL | Status: DC
  Administered 2021-02-24: 500 mg via ORAL
  Filled 2021-02-24 (×3): qty 1

## 2021-02-24 MED ORDER — LEVETIRACETAM 500 MG PO TABS
ORAL_TABLET | ORAL | Status: AC
  Filled 2021-02-24: qty 1

## 2021-02-24 MED ORDER — DEXAMETHASONE 4 MG PO TABS
6.0000 mg | ORAL_TABLET | Freq: Four times a day (QID) | ORAL | Status: DC
  Filled 2021-02-24 (×4): qty 2

## 2021-02-24 MED ORDER — CHLORHEXIDINE GLUCONATE CLOTH 2 % EX PADS: 6.0000 | MEDICATED_PAD | Freq: Every day | CUTANEOUS | Status: DC

## 2021-02-24 NOTE — Consult Note (Signed)
BP 113/60    Pulse (!) 58    Temp 98.1 F (36.7 C) (Oral)    Resp 11    Ht 5\' 5"  (1.651 m)    Wt 71.2 kg    SpO2 99%    BMI 26.13 kg/m  Mr. Dequon was admitted to the ICU secondary to weakness in the left upper extremity, inability to use his left hand,and newly discovered mass in the right temporal, frontal, and parietal lobes.  No Known Allergies No past medical history on file. Past Surgical History:  Procedure Laterality Date   CARDIAC CATHETERIZATION     Extensive hx of heart disease, diabetes mellitus, coronary stenting LAD.  Mri shows large infiltrative enhancing mass in the right hemisphere with choroidal enhancement. Significant mass effect.  Mr. Sheffield is visiting from [place], and his son desires to take him back to [place] his home. I explained that I do not believe the tumor is resectable. Radiation and chemotherapy might be employed.  I also stated that I did not expect him to live beyond a month.  His son simply wants to take him home.  He is currently sedated, no exam possible at this time.

## 2021-02-24 NOTE — Progress Notes (Addendum)
Patient ID: Dennis Fry, male   DOB: [birth year], 66 y.o.   MRN: [id number] BP 113/60    Pulse (!) 58    Temp 98.1 F (36.7 C) (Oral)    Resp 11    Ht 5\' 5"  (1.651 m)    Wt 71.2 kg    SpO2 99%    BMI 26.13 kg/m  Repeat MRI performed. This is most likely a primary brain tumor, glioblastoma. The mass has a multifocal appearance. This is not a resectable lesion as it appears to have infiltrated the choroid and ventricular system. Will speak with [name] later this morning.

## 2021-02-24 NOTE — Plan of Care (Signed)
°  Problem: Consults Goal: RH STROKE PATIENT EDUCATION Description: See Patient Education module for education specifics  Outcome: Progressing   Problem: RH SAFETY Goal: RH STG DECREASED RISK OF FALL WITH ASSISTANCE Description: STG Decreased Risk of Fall With Assistance. Outcome: Progressing

## 2021-02-24 NOTE — Progress Notes (Signed)
V.O., per Dr. Cheral Marker, "change Keppra 500 mg to PO BID from IV. Change Decadron 6 mg to PO from IV Q 6hrs.

## 2021-02-24 NOTE — Progress Notes (Signed)
°  Transition of Care (TOC) Screening Note   Patient Details  Name: Dennis Fry Date of Birth: November 27, 1955   Transition of Care [facility]) CM/SW Contact:    Benard Halsted, LCSW Phone Number: 02/24/2021, 9:21 AM    Transition of Care Department [facility]) has reviewed patient and no TOC needs have been identified at this time. We will continue to monitor patient advancement through interdisciplinary progression rounds. If new patient transition needs arise, please place a TOC consult.

## 2021-02-24 NOTE — Progress Notes (Signed)
Pt's son extremely determined to fly his father Dennis Fry back to [place] this morning. Son spoke w/ Dr. Christella Noa and Dr. Cheral Marker pertaining to this matter and the risk at doing so w/o a thorough discharge process.

## 2021-02-24 NOTE — Progress Notes (Signed)
Family discussion at bedside with Dr. Erlinda Hong and Dr. Christella Noa and interpreter on video chat. Patient expressed his wishes to go home to [place] multiple times. Established patient was alert and oriented. Peripheral IV's removed. Patient left AMA with his wife and son.

## 2021-02-24 NOTE — Progress Notes (Signed)
STROKE TEAM PROGRESS NOTE   SUBJECTIVE (INTERVAL HISTORY) His son and wife and nephew were at the bedside. Dr. Christella Noa was present during rounding. I had ipad interpreter with patient briefly and he was awake alert, orientated x 3. No aphasia or dysarthria, and expressed his wishes to be discharged and go back to [place]. Pt seems to be competent to make his own decision. Before I continued conversation with him, I discussed with son and nephew who can speak english out of courtesy about whether he knew his diagnosis and if he wanted to know whether I could tell him. However, the son and nephew told me that they only told him that he had stroke and they did not want me to tell him about his real diagnosis. I told them that I understood not telling pt right diagnosis might be their cultural issue, however I have obligation in this hospital to tell pt his diagnosis if pt competent and wanted to know his diagnosis. However, we went into argument for this matter, so I paused conversation with pt and brought pt son and nephew to 4N consult room for further discussion. They stated that pt was going to be on airplane tonight back to [place]. If he knew his diagnosis and he then had heart attack in the airplane that would be my fault. I along with our neuro NP, 4N RN Tammy tried to explain to them that pt was competent and we had obligation to let him make his own informed consent about further plan. If pt did not want to know about his final diagnosis or condition, we would not tell him. However, if he did want to know, we had to tell him. Son and nephew became agitated and wanted to leave AMA on behalf of the pt. I tried to get back to talk to patient with ipad interpretor but family did not allow me. And RN Ander Purpura and Kieth Brightly had already dressed patient up and put pt into wheelchair. Family then wheeled patient out of unit. Pt and family did get decardron and keppra with them when going out of unit.   OBJECTIVE Temp:   [97.4 F (36.3 C)-97.7 F (36.5 C)] 97.7 F (36.5 C) (02/09 0800) Pulse Rate:  [55-75] 70 (02/09 0800) Resp:  [11-23] 15 (02/09 0800) BP: (94-141)/(46-71) 114/57 (02/09 0800) SpO2:  [93 %-99 %] 96 % (02/09 0800) Weight:  [67 kg] 67 kg (02/09 0100)  No results for input(s): GLUCAP in the last 168 hours. Recent Labs  Lab 02/21/21 1409 02/23/21 1418 02/23/21 1443 02/23/21 1618 02/24/21 0024  NA 136 131* 132* 132* 136  K 4.2 3.7 3.8  --  4.2  CL 97 97* 100  --  105  CO2 22 20*  --   --  20*  GLUCOSE 160* 92 93  --  110*  BUN 19 22 22   --  20  CREATININE 0.87 0.85 0.70  --  0.87  CALCIUM 10.0 8.9  --   --  8.8*   Recent Labs  Lab 02/21/21 1409 02/23/21 1418 02/24/21 0024  AST 21 19 19   ALT 16 16 15   ALKPHOS 144* 86 76  BILITOT 0.4 1.0 1.0  PROT 7.9 7.7 7.2  ALBUMIN 4.4 3.7 3.4*   Recent Labs  Lab 02/21/21 1409 02/23/21 1418 02/23/21 1443 02/24/21 0024  WBC 13.2* 13.4*  --  11.9*  NEUTROABS  --  8.4*  --   --   HGB 14.2 14.2 16.0 13.6  HCT 44.6 45.6  47.0 42.8  MCV 72* 72.2*  --  72.5*  PLT 318 294  --  320   No results for input(s): CKTOTAL, CKMB, CKMBINDEX, TROPONINI in the last 168 hours. Recent Labs    02/23/21 1418  LABPROT 16.4*  INR 1.3*   Recent Labs    02/24/21 0430  COLORURINE YELLOW  LABSPEC 1.040*  PHURINE 5.0  GLUCOSEU >=500*  HGBUR NEGATIVE  BILIRUBINUR NEGATIVE  KETONESUR 20*  PROTEINUR NEGATIVE  NITRITE NEGATIVE  LEUKOCYTESUR NEGATIVE       Component Value Date/Time   CHOL 107 02/23/2021 1459   TRIG 107 02/23/2021 1459   HDL 29 (L) 02/23/2021 1459   CHOLHDL 3.7 02/23/2021 1459   VLDL 21 02/23/2021 1459   LDLCALC 57 02/23/2021 1459   Lab Results  Component Value Date   HGBA1C 8.9 (H) 02/23/2021      Component Value Date/Time   LABOPIA NONE DETECTED 02/23/2021 1739   COCAINSCRNUR NONE DETECTED 02/23/2021 1739   LABBENZ NONE DETECTED 02/23/2021 1739   AMPHETMU NONE DETECTED 02/23/2021 1739   THCU NONE DETECTED  02/23/2021 1739   LABBARB NONE DETECTED 02/23/2021 1739    Recent Labs  Lab 02/23/21 1418  ETH <10    I have personally reviewed the radiological images below and agree with the radiology interpretations.  CT ANGIO HEAD W OR WO CONTRAST  Result Date: 02/23/2021 CLINICAL DATA:  Follow-up examination for acute stroke. EXAM: CT ANGIOGRAPHY HEAD TECHNIQUE: Multidetector CT imaging of the head was performed using the standard protocol during bolus administration of intravenous contrast. Multiplanar CT image reconstructions and MIPs were obtained to evaluate the vascular anatomy. RADIATION DOSE REDUCTION: This exam was performed according to the departmental dose-optimization program which includes automated exposure control, adjustment of the mA and/or kV according to patient size and/or use of iterative reconstruction technique. CONTRAST:  160mL OMNIPAQUE IOHEXOL 350 MG/ML SOLN COMPARISON:  Comparison made with prior CTA and MRI from earlier the same day. FINDINGS: CTA HEAD Anterior circulation: Visualized distal cervical segments of the internal carotid arteries are widely patent bilaterally. Mild atheromatous change within the carotid siphons without significant stenosis. A1 segments patent bilaterally. Left A1 hypoplastic. Normal anterior communicating artery complex. Anterior cerebral arteries deviated to the left but remain widely patent without stenosis. No M1 stenosis or occlusion. Normal MCA bifurcations. MCA branches well perfused and patent bilaterally. Mass effect on the right MCA branches related to the parenchymal abnormality within the right frontotemporal region. Note also made of prominent neovascularity within this region, likely related to an underlying malignant process. Posterior circulation: Both vertebral arteries widely patent to the vertebrobasilar junction without stenosis. Left PICA patent. Right PICA not seen. Basilar patent to its distal aspect without stenosis. Superior  cerebellar arteries patent bilaterally. Both PCAs primarily supplied via the basilar. PCAs patent to their distal aspects without stenosis. Mild mass effect on the right PCA noted. Venous sinuses: Not well assessed due to timing of the contrast bolus. Anatomic variants: None significant.  No aneurysm. Review of the MIP images confirms the above findings. IMPRESSION: 1. Negative CTA of the head. No large vessel occlusion, hemodynamically significant stenosis, or other acute vascular abnormality. 2. Regional mass effect with neovascularity within the right frontotemporal region, suspicious for an underlying malignant process. Further assessment with dedicated MRI of the brain, with and without contrast, recommended as the patient is able to tolerate. Electronically Signed   By: Jeannine Boga M.D.   On: 02/23/2021 20:09   CT HEAD WO CONTRAST (5MM)  Result Date: 02/23/2021 CLINICAL DATA:  Gait instability. EXAM: CT HEAD WITHOUT CONTRAST TECHNIQUE: Contiguous axial images were obtained from the base of the skull through the vertex without intravenous contrast. RADIATION DOSE REDUCTION: This exam was performed according to the departmental dose-optimization program which includes automated exposure control, adjustment of the mA and/or kV according to patient size and/or use of iterative reconstruction technique. COMPARISON:  None. FINDINGS: Brain: There is extensive subcortical low density consistent with edema resulting in significant mass effect in midline shift. There is approximately 11 mm of right to left midline shift. Compression of the right lateral ventricle is noted. High density material is noted in the right temporal horn which may represent the choroid plexus which is accentuated due to the surrounding low density, although possible intra ventricular hemorrhage cannot be excluded. Prominence of the cortical gyri of the right temporal and parietal lobes is also noted which most likely is essential  weighted due to the surrounding low density, although underlying mass or possible hemorrhage cannot be excluded. Vascular: Relative high density is noted in the right MCA which may be due to extend to a shin due to the surrounding low density, although thrombus cannot be excluded. Skull: Normal. Negative for fracture or focal lesion. Sinuses/Orbits: No acute finding. Other: None. IMPRESSION: Extensive probable subcortical white matter low density is noted resulting in significant mass effect and 11 mm of right to left midline shift. Hyperdensity is noted in right temporal horn which most likely represents choroid plexus accentuated due to surrounding low density, but intraventricular hemorrhage cannot be excluded. Prominent and thickened cortical gyri are noted in right temporal and parietal cortices which are more prominent most likely due to surrounding low density, but hemorrhage cannot be excluded. Possibility of right MCA thrombus cannot be excluded. Further evaluation with MRI is recommended. Critical Value/emergent results were called by telephone at the time of interpretation on 02/23/2021 at 10:19 am to provider Adrian Prows , who verbally acknowledged these results. Electronically Signed   By: Marijo Conception M.D.   On: 02/23/2021 10:20   MR BRAIN WO CONTRAST  Result Date: 02/23/2021 CLINICAL DATA:  Provided history: Neuro deficit, acute, stroke suspected. EXAM: MRI HEAD WITHOUT CONTRAST TECHNIQUE: Multiplanar, multiecho pulse sequences of the brain and surrounding structures were obtained without intravenous contrast. COMPARISON:  No pertinent prior exams available for comparison. FINDINGS: The patient was unable to tolerate the full examination. The following sequences could be acquired prior to examination termination: axial and coronal diffusion-weighted sequences, sagittal T1-weighted sequence, axial T2 TSE sequences and an axial T2 FLAIR sequence. The acquired sequences are significantly motion  degraded, limiting evaluation. Most notably, there is severe motion degradation of the sagittal T1 weighted sequence, of the axial T2 TSE sequences and of the axial T2 FLAIR sequence. Brain: Mild generalized cerebral and cerebellar atrophy. There is extensive cortical/subcortical T2 FLAIR hyperintense signal abnormality within the right temporal, frontal, parietal and lateral occipital lobes. Associated gyral thickening centered along the right sylvian fissure. Extensive T2 FLAIR hyperintense signal abnormality also extends into the right basal ganglia, right internal and external capsules, right thalamus and right midbrain. There is prominent mass effect with 13 mm leftward midline shift measured at the level of the septum pellucidum with extensive partial effacement of the right lateral ventricle. Additionally, there is partial effacement of the basal cisterns on the right. No convincing evidence of hydrocephalus or ventricular entrapment at this time. There are superimposed curvilinear and patchy foci of diffusion-weighted signal abnormality centered along  the right sylvian fissure. Additional punctate focus of diffusion-weighted signal abnormality within the right parietal lobe (series 3, image 32)). Additionally, there is a 2.7 cm globular focus of diffusion-weighted and T2 FLAIR signal abnormality within the right parietal white matter, extending to the margin of the right lateral ventricle (for instance as seen on series 3, image 35). Background multifocal T2 FLAIR hyperintense signal abnormality within the cerebral white matter, nonspecific but compatible with chronic small vessel ischemic disease. No appreciable extra-axial fluid collection. Vascular: Significant motion degradation precludes adequate evaluation for proximal large arterial flow voids. Skull and upper cervical spine: Within described limitations, no definite focal suspicious marrow lesion is identified. Sinuses/Orbits: Within described  limitations, no acute orbital abnormality is appreciated. Mild paranasal sinus disease was better appreciated on the CT head performed earlier today. IMPRESSION: Prematurely terminated, significantly motion degraded and significantly limited non-contrast brain MRI. Extensive T2 FLAIR hyperintense signal abnormality within the right temporal, frontal, parietal and lateral occipital lobes extending into the right basal ganglia, internal and external capsules and midbrain as detailed. There are superimposed foci of diffusion-weighted signal abnormality. Additionally, there is gyral thickening centered along the right sylvian fissure. The imaging features are nonspecific on this non-contrast and motion degraded examination. Differential considerations include high-grade primary CNS neoplasm/ Glioblastoma multiforme, lymphoma or metastatic disease with extensive vasogenic edema. A recent large right MCA territory infarct is also possible, but not favored. A repeat brain MRI with contrast is recommended for better imaging characterization when the patient is better able to tolerate the study. Significant mass effect with 13 mm leftward midline shift measured at the level of the septum pellucidum. Extensive partial effacement of the right lateral ventricle. Additionally, there is partial effacement of the basal cisterns on the right. No convincing evidence of hydrocephalus or ventricular entrapment at this time. Background generalized parenchymal atrophy and chronic small vessel ischemic disease. Electronically Signed   By: Kellie Simmering D.O.   On: 02/23/2021 17:06   MR BRAIN W WO CONTRAST  Result Date: 02/24/2021 CLINICAL DATA:  Follow-up examination for stroke. EXAM: MRI HEAD WITHOUT AND WITH CONTRAST TECHNIQUE: Multiplanar, multiecho pulse sequences of the brain and surrounding structures were obtained without and with intravenous contrast. CONTRAST:  76mL GADAVIST GADOBUTROL 1 MMOL/ML IV SOLN COMPARISON:  Multiple  previous studies from earlier the same day. FINDINGS: Brain: Examination mildly degraded by motion. Cerebral volume within normal limits. No evidence for acute or subacute infarct. Large heterogeneous intra-axial mass centered at the right frontotemporal region measures 6.9 x 5.7 x 5.2 cm (AP by craniocaudad by transverse). Lesion demonstrates irregular and fairly avid postcontrast enhancement. Scattered areas of internal central necrosis, with minimal associated blood products. A second but partially contiguous component extending towards the right parietal lobe measures 3.4 x 2.5 x 2.3 cm (series 13, image 12). Probable small amount of patchy enhancement noted extending along the right cerebral peduncle into the right midbrain (series 11, image 25). Additionally, lesion abuts the atrium of the right lateral ventricle with question of possible ependymal involvement/enhancement (series 11, image 28). Surrounding T2/FLAIR signal abnormality consistent with vasogenic edema and/or nonenhancing infiltrative tumor. Changes extend into the right cerebral peduncle and midbrain. Right lateral ventricle is largely effaced. Associated right-to-left shift measures up to 11 mm. No hydrocephalus or trapping at this time. Basilar cisterns remain patent. No other mass lesion or abnormal enhancement. No extra-axial fluid collection. Pituitary gland suprasellar region normal. Vascular: Major intracranial vascular flow voids are maintained. Skull and upper cervical spine: Craniocervical junction  within normal limits. Bone marrow signal intensity normal. No focal marrow replacing lesion. No scalp soft tissue abnormality. Sinuses/Orbits: Increased CSF signal intensity along the optic nerve sheaths compatible with elevated intracranial pressures. Globes and orbital soft tissues demonstrate no other acute finding. Scattered mucosal thickening noted within the ethmoidal air cells. No mastoid effusion. Other: None. 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 Associated regional mass effect with up to 11 mm of right-to-left shift. No hydrocephalus or ventricular trapping at this time. Electronically Signed   By: Jeannine Boga M.D.   On: 02/24/2021 01:12   CT CHEST ABDOMEN PELVIS W CONTRAST  Result Date: 02/23/2021 CLINICAL DATA:  Suspected brain mass EXAM: CT CHEST, ABDOMEN, AND PELVIS WITH CONTRAST TECHNIQUE: Multidetector CT imaging of the chest, abdomen and pelvis was performed following the standard protocol during bolus administration of intravenous contrast. RADIATION DOSE REDUCTION: This exam was performed according to the departmental dose-optimization program which includes automated exposure control, adjustment of the mA and/or kV according to patient size and/or use of iterative reconstruction technique. CONTRAST:  169mL OMNIPAQUE IOHEXOL 350 MG/ML SOLN COMPARISON:  CT and MRI 02/23/2021 FINDINGS: CT CHEST FINDINGS Cardiovascular: Aorta is nonaneurysmal. Moderate aortic atherosclerosis. Coronary vascular calcification. Normal cardiac size. No pericardial effusion Mediastinum/Nodes: Midline trachea. No thyroid mass. Mild circumferential diffuse esophageal wall thickening with postsurgical changes/apparent metallic clips at the GE junction. No suspicious lymph nodes Lungs/Pleura: No acute consolidation, pleural effusion or pneumothorax. Punctate right middle lobe pulmonary nodule measuring 3 mm, series 6, image 75. Punctate right lower lobe pulmonary nodules measuring up to 3 mm, series 6, image 87 and series 6, image 85. Musculoskeletal: No acute or suspicious osseous lesion CT ABDOMEN PELVIS FINDINGS Hepatobiliary: No focal liver abnormality is seen. No gallstones, gallbladder wall thickening, or biliary dilatation. Pancreas: Unremarkable. No pancreatic ductal dilatation or surrounding  inflammatory changes. Spleen: Normal in size without focal abnormality. Adrenals/Urinary Tract: Adrenal glands are unremarkable. Kidneys are normal, without renal calculi, focal lesion, or hydronephrosis. Bladder is unremarkable. Stomach/Bowel: Stomach is within normal limits. Appendix appears normal. No evidence of bowel wall thickening, distention, or inflammatory changes. Vascular/Lymphatic: Moderate aortic atherosclerosis. No aneurysm. No suspicious lymph nodes. Retroaortic left renal vein. Reproductive: Prostate is unremarkable. Other: Negative for pelvic effusion or free air. Musculoskeletal: No acute or suspicious osseous abnormality. IMPRESSION: 1. No CT evidence for acute intrathoracic, intra-abdominal, or intrapelvic abnormality. No suspicious thoracic or abdominopelvic mass lesions. 2. Punctate right middle lobe and lower lobe pulmonary nodules measuring up to 3 mm. These are nonspecific in appearance, but consider short interval 3 to six-month CT follow-up given brain imaging findings. 3. Mild diffuse esophageal wall thickening could be due to reflux or esophagitis Electronically Signed   By: Donavan Foil M.D.   On: 02/23/2021 19:20   ECHOCARDIOGRAM COMPLETE  Result Date: 02/23/2021    ECHOCARDIOGRAM REPORT   Patient Name:   Dennis Fry Date of Exam: 02/23/2021 Medical Rec #:  [id number]       Height:       65.0 in Accession #:    [id number]      Weight:       157.0 lb Date of Birth:  09-26-1955        BSA:          1.785 m Patient Age:    6 years        BP:  119/69 mmHg Patient Gender: M               HR:           70 bpm. Exam Location:  Inpatient Procedure: 2D Echo, Cardiac Doppler and Color Doppler Indications:    Stroke I63.9  History:        Patient has no prior history of Echocardiogram examinations.  Sonographer:    Bernadene Person RDCS Referring Phys: [id number] [place] E [place] LA [place]  1. Left ventricular ejection fraction, by estimation, is 60 to 65%. The left ventricle has  normal function. The left ventricle has no regional wall motion abnormalities. There is mild left ventricular hypertrophy.  2. Right ventricular systolic function is normal. The right ventricular size is normal. There is normal pulmonary artery systolic pressure.  3. The mitral valve is grossly normal. No evidence of mitral valve regurgitation.  4. The aortic valve is grossly normal. Aortic valve regurgitation is trivial. Comparison(s): No prior Echocardiogram. FINDINGS  Left Ventricle: Left ventricular ejection fraction, by estimation, is 60 to 65%. The left ventricle has normal function. The left ventricle has no regional wall motion abnormalities. The left ventricular internal cavity size was normal in size. There is  mild left ventricular hypertrophy. Right Ventricle: The right ventricular size is normal. No increase in right ventricular wall thickness. Right ventricular systolic function is normal. There is normal pulmonary artery systolic pressure. The tricuspid regurgitant velocity is 2.35 m/s, and  with an assumed right atrial pressure of 3 mmHg, the estimated right ventricular systolic pressure is 28.3 mmHg. Left Atrium: Left atrial size was normal in size. Right Atrium: Right atrial size was normal in size. Pericardium: There is no evidence of pericardial effusion. Mitral Valve: The mitral valve is grossly normal. There is moderate calcification of the mitral valve leaflet(s). No evidence of mitral valve regurgitation. Tricuspid Valve: The tricuspid valve is grossly normal. Tricuspid valve regurgitation is not demonstrated. Aortic Valve: The aortic valve is grossly normal. Aortic valve regurgitation is trivial. Aortic regurgitation PHT measures 691 msec. Pulmonic Valve: The pulmonic valve was grossly normal. Pulmonic valve regurgitation is not visualized. Aorta: The aortic root and ascending aorta are structurally normal, with no evidence of dilitation. IAS/Shunts: No atrial level shunt detected by color  flow Doppler.  LEFT VENTRICLE PLAX 2D LVIDd:         3.50 cm      Diastology LVIDs:         2.50 cm      LV e' medial:    6.00 cm/s LV PW:         1.00 cm      LV E/e' medial:  13.2 LV IVS:        1.20 cm      LV e' lateral:   11.10 cm/s LVOT diam:     2.20 cm      LV E/e' lateral: 7.1 LV SV:         72 LV SV Index:   40 LVOT Area:     3.80 cm  LV Volumes (MOD) LV vol d, MOD A2C: 72.3 ml LV vol d, MOD A4C: 106.0 ml LV vol s, MOD A2C: 29.5 ml LV vol s, MOD A4C: 39.3 ml LV SV MOD A2C:     42.8 ml LV SV MOD A4C:     106.0 ml LV SV MOD BP:      54.1 ml RIGHT VENTRICLE RV S prime:     11.30 cm/s  TAPSE (M-mode): 2.0 cm LEFT ATRIUM           Index        RIGHT ATRIUM           Index LA diam:      4.50 cm 2.52 cm/m   RA Area:     12.00 cm LA Vol (A2C): 33.1 ml 18.55 ml/m  RA Volume:   25.60 ml  14.34 ml/m LA Vol (A4C): 40.9 ml 22.92 ml/m  AORTIC VALVE LVOT Vmax:         93.40 cm/s LVOT Vmean:        58.700 cm/s LVOT VTI:          0.189 m AI PHT:            691 msec AR Vena Contracta: 0.10 cm  AORTA Ao Root diam: 3.10 cm Ao Asc diam:  3.30 cm MITRAL VALVE               TRICUSPID VALVE MV Area (PHT): 2.99 cm    TR Peak grad:   22.1 mmHg MV Decel Time: 254 msec    TR Vmax:        235.00 cm/s MV E velocity: 79.30 cm/s MV A velocity: 45.00 cm/s  SHUNTS MV E/A ratio:  1.76        Systemic VTI:  0.19 m                            Systemic Diam: 2.20 cm Phineas Inches Electronically signed by Phineas Inches Signature Date/Time: 02/23/2021/6:11:50 PM    Final      PHYSICAL EXAM  Temp:  [97.4 F (36.3 C)-97.7 F (36.5 C)] 97.7 F (36.5 C) (02/09 0800) Pulse Rate:  [55-75] 70 (02/09 0800) Resp:  [11-23] 15 (02/09 0800) BP: (94-141)/(46-71) 114/57 (02/09 0800) SpO2:  [93 %-99 %] 96 % (02/09 0800) Weight:  [37 kg] 67 kg (02/09 0100)  General - Well nourished, well developed, in no apparent distress.  Level of arousal and orientation to time, place, and person were intact. Language including expression, and comprehension  was assessed and found intact.  No further exam was able to performed (see above)  ASSESSMENT/PLAN Mr. Dennis Fry is a 66 y.o. male with history of afib on eliquis, DM, HLD, CAD s/p stenting 3 months ago admitted for left hand weakness, left leg weakness, HA and difficulty walking, progressive since 1/29.  Brain malignancy CT Extensive probable subcortical white matter low density is noted resulting in significant mass effect and 11 mm of right to left midline shift.  CTA head and neck - no LVO, regional mass effect with neovascularity within the right frontotemporal region, suspicious for an underlying malignant process.  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 Associated regional mass effect with up to 11 mm of right-to-left shift. 2D Echo  EF 60-65% LDL 57 HgbA1c 8.9 SCDs for VTE prophylaxis clopidogrel 75 mg daily and Eliquis (apixaban) daily prior to admission, nothing prescribed at [facility]  Afib NSR in hospital  On eliquis PTA Given brain tumor and significant MLS, would not recommend to continue eliquis at this time.   Diabetes HgbA1c 8.9 goal < 7.0 Uncontrolled Follow up with PCP in [place]  Hypertension Stable  Hyperlipidemia Home meds:  lipitor 40  LDL 57, goal < 70 OK to continue statin at home  Other active problems Coronary artery disease s/p stenting 3 months ago   Hospital day # 1  This patient is critically ill due to brain tumor, cerebral edema, PAF, CAD and at significant risk of neurological worsening, death form brain herniation, heart failure, cardiac arrest. This patient's care requires constant monitoring of vital signs, hemodynamics, respiratory and cardiac monitoring, review of multiple databases, neurological assessment, discussion with family, other specialists and medical decision making of high complexity. I spent  60 minutes of neurocritical care time in the care of this patient.   Rosalin Hawking, MD PhD Stroke Neurology 02/24/2021 4:03 PM    To contact Stroke Continuity provider, please refer to [email or web address] After hours, contact General Neurology

## 2021-02-25 NOTE — Discharge Summary (Signed)
Stroke Discharge Summary  Patient ID: Dennis Fry   MRN: [id number]      DOB: May 23, 1955  Date of Admission: 02/23/2021 Date of Discharge: 02/25/2021  Attending Physician:  Stroke MD Consultant(s):   Treatment Team:  Ashok Pall, MD  Patient's PCP:  Pcp, No  DISCHARGE DIAGNOSIS:  Principal Problem:  Brain tumor  Secondary diagnosis A-fib on [facility] CAD status post stenting Diabetes Hypertension Hyperlipidemia   Allergies as of 02/24/2021   No Known Allergies      Medication List     ASK your doctor about these medications    apixaban 5 MG Tabs tablet Commonly known as: ELIQUIS Take 5 mg by mouth 2 (two) times daily.   atorvastatin 40 MG tablet Commonly known as: LIPITOR Take 40 mg by mouth daily.   bisoprolol 5 MG tablet Commonly known as: ZEBETA Take 2.5 mg by mouth daily.   clopidogrel 75 MG tablet Commonly known as: PLAVIX Take 75 mg by mouth daily.   empagliflozin 25 MG Tabs tablet Commonly known as: JARDIANCE Take 12.5 mg by mouth daily.   esomeprazole 20 MG capsule Commonly known as: NEXIUM Take 20 mg by mouth daily at 12 noon.   glipiZIDE 10 MG tablet Commonly known as: GLUCOTROL Take 60 mg by mouth daily before breakfast.   sitaGLIPtin-metformin 50-1000 MG tablet Commonly known as: JANUMET Take 1 tablet by mouth 2 (two) times daily with a meal.        LABORATORY STUDIES CBC    Component Value Date/Time   WBC 11.9 (H) 02/24/2021 0024   RBC 5.90 (H) 02/24/2021 0024   HGB 13.6 02/24/2021 0024   HGB 14.2 02/21/2021 1409   HCT 42.8 02/24/2021 0024   HCT 44.6 02/21/2021 1409   PLT 320 02/24/2021 0024   PLT 318 02/21/2021 1409   MCV 72.5 (L) 02/24/2021 0024   MCV 72 (L) 02/21/2021 1409   MCH 23.1 (L) 02/24/2021 0024   MCHC 31.8 02/24/2021 0024   RDW 14.6 02/24/2021 0024   RDW 14.6 02/21/2021 1409   LYMPHSABS 3.5 02/23/2021 1418   MONOABS 0.9 02/23/2021 1418   EOSABS 0.3 02/23/2021 1418   BASOSABS 0.0 02/23/2021 1418   CMP     Component Value Date/Time   NA 136 02/24/2021 0024   NA 136 02/21/2021 1409   K 4.2 02/24/2021 0024   CL 105 02/24/2021 0024   CO2 20 (L) 02/24/2021 0024   GLUCOSE 110 (H) 02/24/2021 0024   BUN 20 02/24/2021 0024   BUN 19 02/21/2021 1409   CREATININE 0.87 02/24/2021 0024   CALCIUM 8.8 (L) 02/24/2021 0024   PROT 7.2 02/24/2021 0024   PROT 7.9 02/21/2021 1409   ALBUMIN 3.4 (L) 02/24/2021 0024   ALBUMIN 4.4 02/21/2021 1409   AST 19 02/24/2021 0024   ALT 15 02/24/2021 0024   ALKPHOS 76 02/24/2021 0024   BILITOT 1.0 02/24/2021 0024   BILITOT 0.4 02/21/2021 1409   GFRNONAA >60 02/24/2021 0024   COAGS Lab Results  Component Value Date   INR 1.3 (H) 02/23/2021   Lipid Panel    Component Value Date/Time   CHOL 107 02/23/2021 1459   TRIG 107 02/23/2021 1459   HDL 29 (L) 02/23/2021 1459   CHOLHDL 3.7 02/23/2021 1459   VLDL 21 02/23/2021 1459   LDLCALC 57 02/23/2021 1459   HgbA1C  Lab Results  Component Value Date   HGBA1C 8.9 (H) 02/23/2021   Urinalysis    Component Value Date/Time  COLORURINE YELLOW 02/24/2021 0430   APPEARANCEUR CLEAR 02/24/2021 0430   LABSPEC 1.040 (H) 02/24/2021 0430   PHURINE 5.0 02/24/2021 0430   GLUCOSEU >=500 (A) 02/24/2021 0430   HGBUR NEGATIVE 02/24/2021 0430   BILIRUBINUR NEGATIVE 02/24/2021 0430   KETONESUR 20 (A) 02/24/2021 0430   PROTEINUR NEGATIVE 02/24/2021 0430   NITRITE NEGATIVE 02/24/2021 0430   LEUKOCYTESUR NEGATIVE 02/24/2021 0430   Urine Drug Screen     Component Value Date/Time   LABOPIA NONE DETECTED 02/23/2021 1739   COCAINSCRNUR NONE DETECTED 02/23/2021 1739   LABBENZ NONE DETECTED 02/23/2021 1739   AMPHETMU NONE DETECTED 02/23/2021 1739   THCU NONE DETECTED 02/23/2021 1739   LABBARB NONE DETECTED 02/23/2021 1739    Alcohol Level    Component Value Date/Time   ETH <10 02/23/2021 1418     SIGNIFICANT DIAGNOSTIC STUDIES CT ANGIO HEAD W OR WO CONTRAST  Result Date: 02/23/2021 CLINICAL DATA:  Follow-up  examination for acute stroke. EXAM: CT ANGIOGRAPHY HEAD TECHNIQUE: Multidetector CT imaging of the head was performed using the standard protocol during bolus administration of intravenous contrast. Multiplanar CT image reconstructions and MIPs were obtained to evaluate the vascular anatomy. RADIATION DOSE REDUCTION: This exam was performed according to the departmental dose-optimization program which includes automated exposure control, adjustment of the mA and/or kV according to patient size and/or use of iterative reconstruction technique. CONTRAST:  165mL OMNIPAQUE IOHEXOL 350 MG/ML SOLN COMPARISON:  Comparison made with prior CTA and MRI from earlier the same day. FINDINGS: CTA HEAD Anterior circulation: Visualized distal cervical segments of the internal carotid arteries are widely patent bilaterally. Mild atheromatous change within the carotid siphons without significant stenosis. A1 segments patent bilaterally. Left A1 hypoplastic. Normal anterior communicating artery complex. Anterior cerebral arteries deviated to the left but remain widely patent without stenosis. No M1 stenosis or occlusion. Normal MCA bifurcations. MCA branches well perfused and patent bilaterally. Mass effect on the right MCA branches related to the parenchymal abnormality within the right frontotemporal region. Note also made of prominent neovascularity within this region, likely related to an underlying malignant process. Posterior circulation: Both vertebral arteries widely patent to the vertebrobasilar junction without stenosis. Left PICA patent. Right PICA not seen. Basilar patent to its distal aspect without stenosis. Superior cerebellar arteries patent bilaterally. Both PCAs primarily supplied via the basilar. PCAs patent to their distal aspects without stenosis. Mild mass effect on the right PCA noted. Venous sinuses: Not well assessed due to timing of the contrast bolus. Anatomic variants: None significant.  No aneurysm. Review  of the MIP images confirms the above findings. IMPRESSION: 1. Negative CTA of the head. No large vessel occlusion, hemodynamically significant stenosis, or other acute vascular abnormality. 2. Regional mass effect with neovascularity within the right frontotemporal region, suspicious for an underlying malignant process. Further assessment with dedicated MRI of the brain, with and without contrast, recommended as the patient is able to tolerate. Electronically Signed   By: Jeannine Boga M.D.   On: 02/23/2021 20:09   CT HEAD WO CONTRAST (5MM)  Result Date: 02/23/2021 CLINICAL DATA:  Gait instability. EXAM: CT HEAD WITHOUT CONTRAST TECHNIQUE: Contiguous axial images were obtained from the base of the skull through the vertex without intravenous contrast. RADIATION DOSE REDUCTION: This exam was performed according to the departmental dose-optimization program which includes automated exposure control, adjustment of the mA and/or kV according to patient size and/or use of iterative reconstruction technique. COMPARISON:  None. FINDINGS: Brain: There is extensive subcortical low density consistent with edema  resulting in significant mass effect in midline shift. There is approximately 11 mm of right to left midline shift. Compression of the right lateral ventricle is noted. High density material is noted in the right temporal horn which may represent the choroid plexus which is accentuated due to the surrounding low density, although possible intra ventricular hemorrhage cannot be excluded. Prominence of the cortical gyri of the right temporal and parietal lobes is also noted which most likely is essential weighted due to the surrounding low density, although underlying mass or possible hemorrhage cannot be excluded. Vascular: Relative high density is noted in the right MCA which may be due to extend to a shin due to the surrounding low density, although thrombus cannot be excluded. Skull: Normal. Negative for  fracture or focal lesion. Sinuses/Orbits: No acute finding. Other: None. IMPRESSION: Extensive probable subcortical white matter low density is noted resulting in significant mass effect and 11 mm of right to left midline shift. Hyperdensity is noted in right temporal horn which most likely represents choroid plexus accentuated due to surrounding low density, but intraventricular hemorrhage cannot be excluded. Prominent and thickened cortical gyri are noted in right temporal and parietal cortices which are more prominent most likely due to surrounding low density, but hemorrhage cannot be excluded. Possibility of right MCA thrombus cannot be excluded. Further evaluation with MRI is recommended. Critical Value/emergent results were called by telephone at the time of interpretation on 02/23/2021 at 10:19 am to provider Adrian Prows , who verbally acknowledged these results. Electronically Signed   By: Marijo Conception M.D.   On: 02/23/2021 10:20   MR BRAIN WO CONTRAST  Result Date: 02/23/2021 CLINICAL DATA:  Provided history: Neuro deficit, acute, stroke suspected. EXAM: MRI HEAD WITHOUT CONTRAST TECHNIQUE: Multiplanar, multiecho pulse sequences of the brain and surrounding structures were obtained without intravenous contrast. COMPARISON:  No pertinent prior exams available for comparison. FINDINGS: The patient was unable to tolerate the full examination. The following sequences could be acquired prior to examination termination: axial and coronal diffusion-weighted sequences, sagittal T1-weighted sequence, axial T2 TSE sequences and an axial T2 FLAIR sequence. The acquired sequences are significantly motion degraded, limiting evaluation. Most notably, there is severe motion degradation of the sagittal T1 weighted sequence, of the axial T2 TSE sequences and of the axial T2 FLAIR sequence. Brain: Mild generalized cerebral and cerebellar atrophy. There is extensive cortical/subcortical T2 FLAIR hyperintense signal  abnormality within the right temporal, frontal, parietal and lateral occipital lobes. Associated gyral thickening centered along the right sylvian fissure. Extensive T2 FLAIR hyperintense signal abnormality also extends into the right basal ganglia, right internal and external capsules, right thalamus and right midbrain. There is prominent mass effect with 13 mm leftward midline shift measured at the level of the septum pellucidum with extensive partial effacement of the right lateral ventricle. Additionally, there is partial effacement of the basal cisterns on the right. No convincing evidence of hydrocephalus or ventricular entrapment at this time. There are superimposed curvilinear and patchy foci of diffusion-weighted signal abnormality centered along the right sylvian fissure. Additional punctate focus of diffusion-weighted signal abnormality within the right parietal lobe (series 3, image 32)). Additionally, there is a 2.7 cm globular focus of diffusion-weighted and T2 FLAIR signal abnormality within the right parietal white matter, extending to the margin of the right lateral ventricle (for instance as seen on series 3, image 35). Background multifocal T2 FLAIR hyperintense signal abnormality within the cerebral white matter, nonspecific but compatible with chronic small vessel ischemic  disease. No appreciable extra-axial fluid collection. Vascular: Significant motion degradation precludes adequate evaluation for proximal large arterial flow voids. Skull and upper cervical spine: Within described limitations, no definite focal suspicious marrow lesion is identified. Sinuses/Orbits: Within described limitations, no acute orbital abnormality is appreciated. Mild paranasal sinus disease was better appreciated on the CT head performed earlier today. IMPRESSION: Prematurely terminated, significantly motion degraded and significantly limited non-contrast brain MRI. Extensive T2 FLAIR hyperintense signal abnormality  within the right temporal, frontal, parietal and lateral occipital lobes extending into the right basal ganglia, internal and external capsules and midbrain as detailed. There are superimposed foci of diffusion-weighted signal abnormality. Additionally, there is gyral thickening centered along the right sylvian fissure. The imaging features are nonspecific on this non-contrast and motion degraded examination. Differential considerations include high-grade primary CNS neoplasm/ Glioblastoma multiforme, lymphoma or metastatic disease with extensive vasogenic edema. A recent large right MCA territory infarct is also possible, but not favored. A repeat brain MRI with contrast is recommended for better imaging characterization when the patient is better able to tolerate the study. Significant mass effect with 13 mm leftward midline shift measured at the level of the septum pellucidum. Extensive partial effacement of the right lateral ventricle. Additionally, there is partial effacement of the basal cisterns on the right. No convincing evidence of hydrocephalus or ventricular entrapment at this time. Background generalized parenchymal atrophy and chronic small vessel ischemic disease. Electronically Signed   By: Kellie Simmering D.O.   On: 02/23/2021 17:06   MR BRAIN W WO CONTRAST  Result Date: 02/24/2021 CLINICAL DATA:  Follow-up examination for stroke. EXAM: MRI HEAD WITHOUT AND WITH CONTRAST TECHNIQUE: Multiplanar, multiecho pulse sequences of the brain and surrounding structures were obtained without and with intravenous contrast. CONTRAST:  74mL GADAVIST GADOBUTROL 1 MMOL/ML IV SOLN COMPARISON:  Multiple previous studies from earlier the same day. FINDINGS: Brain: Examination mildly degraded by motion. Cerebral volume within normal limits. No evidence for acute or subacute infarct. Large heterogeneous intra-axial mass centered at the right frontotemporal region measures 6.9 x 5.7 x 5.2 cm (AP by craniocaudad by  transverse). Lesion demonstrates irregular and fairly avid postcontrast enhancement. Scattered areas of internal central necrosis, with minimal associated blood products. A second but partially contiguous component extending towards the right parietal lobe measures 3.4 x 2.5 x 2.3 cm (series 13, image 12). Probable small amount of patchy enhancement noted extending along the right cerebral peduncle into the right midbrain (series 11, image 25). Additionally, lesion abuts the atrium of the right lateral ventricle with question of possible ependymal involvement/enhancement (series 11, image 28). Surrounding T2/FLAIR signal abnormality consistent with vasogenic edema and/or nonenhancing infiltrative tumor. Changes extend into the right cerebral peduncle and midbrain. Right lateral ventricle is largely effaced. Associated right-to-left shift measures up to 11 mm. No hydrocephalus or trapping at this time. Basilar cisterns remain patent. No other mass lesion or abnormal enhancement. No extra-axial fluid collection. Pituitary gland suprasellar region normal. Vascular: Major intracranial vascular flow voids are maintained. Skull and upper cervical spine: Craniocervical junction within normal limits. Bone marrow signal intensity normal. No focal marrow replacing lesion. No scalp soft tissue abnormality. Sinuses/Orbits: Increased CSF signal intensity along the optic nerve sheaths compatible with elevated intracranial pressures. Globes and orbital soft tissues demonstrate no other acute finding. Scattered mucosal thickening noted within the ethmoidal air cells. No mastoid effusion. Other: None. 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 Associated regional mass effect with up to 11 mm of right-to-left shift. No hydrocephalus or ventricular trapping at this time. Electronically Signed   By:  Jeannine Boga M.D.   On: 02/24/2021 01:12   CT CHEST ABDOMEN PELVIS W CONTRAST  Result Date: 02/23/2021 CLINICAL DATA:  Suspected brain mass EXAM: CT CHEST, ABDOMEN, AND PELVIS WITH CONTRAST TECHNIQUE: Multidetector CT imaging of the chest, abdomen and pelvis was performed following the standard protocol during bolus administration of intravenous contrast. RADIATION DOSE REDUCTION: This exam was performed according to the departmental dose-optimization program which includes automated exposure control, adjustment of the mA and/or kV according to patient size and/or use of iterative reconstruction technique. CONTRAST:  129mL OMNIPAQUE IOHEXOL 350 MG/ML SOLN COMPARISON:  CT and MRI 02/23/2021 FINDINGS: CT CHEST FINDINGS Cardiovascular: Aorta is nonaneurysmal. Moderate aortic atherosclerosis. Coronary vascular calcification. Normal cardiac size. No pericardial effusion Mediastinum/Nodes: Midline trachea. No thyroid mass. Mild circumferential diffuse esophageal wall thickening with postsurgical changes/apparent metallic clips at the GE junction. No suspicious lymph nodes Lungs/Pleura: No acute consolidation, pleural effusion or pneumothorax. Punctate right middle lobe pulmonary nodule measuring 3 mm, series 6, image 75. Punctate right lower lobe pulmonary nodules measuring up to 3 mm, series 6, image 87 and series 6, image 85. Musculoskeletal: No acute or suspicious osseous lesion CT ABDOMEN PELVIS FINDINGS Hepatobiliary: No focal liver abnormality is seen. No gallstones, gallbladder wall thickening, or biliary dilatation. Pancreas: Unremarkable. No pancreatic ductal dilatation or surrounding inflammatory changes. Spleen: Normal in size without focal abnormality. Adrenals/Urinary Tract: Adrenal glands are unremarkable. Kidneys are normal, without renal calculi, focal lesion, or hydronephrosis. Bladder is unremarkable. Stomach/Bowel: Stomach is within normal limits. Appendix appears normal. No evidence of bowel  wall thickening, distention, or inflammatory changes. Vascular/Lymphatic: Moderate aortic atherosclerosis. No aneurysm. No suspicious lymph nodes. Retroaortic left renal vein. Reproductive: Prostate is unremarkable. Other: Negative for pelvic effusion or free air. Musculoskeletal: No acute or suspicious osseous abnormality. IMPRESSION: 1. No CT evidence for acute intrathoracic, intra-abdominal, or intrapelvic abnormality. No suspicious thoracic or abdominopelvic mass lesions. 2. Punctate right middle lobe and lower lobe pulmonary nodules measuring up to 3 mm. These are nonspecific in appearance, but consider short interval 3 to six-month CT follow-up given brain imaging findings. 3. Mild diffuse esophageal wall thickening could be due to reflux or esophagitis Electronically Signed   By: Donavan Foil M.D.   On: 02/23/2021 19:20   ECHOCARDIOGRAM COMPLETE  Result Date: 02/23/2021    ECHOCARDIOGRAM REPORT   Patient Name:   Dennis Fry Date of Exam: 02/23/2021 Medical Rec #:  [id number]       Height:       65.0 in Accession #:    [id number]      Weight:       157.0 lb Date of Birth:  08/13/1955        BSA:          1.785 m Patient Age:    88 years        BP:           119/69 mmHg Patient Gender: M               HR:           70 bpm. Exam Location:  Inpatient Procedure: 2D Echo, Cardiac Doppler and Color Doppler Indications:    Stroke I63.9  History:        Patient has no prior history of Echocardiogram examinations.  Sonographer:    Bernadene Person  RDCS Referring Phys: [id number] [place]  1. Left ventricular ejection fraction, by estimation, is 60 to 65%. The left ventricle has normal function. The left ventricle has no regional wall motion abnormalities. There is mild left ventricular hypertrophy.  2. Right ventricular systolic function is normal. The right ventricular size is normal. There is normal pulmonary artery systolic pressure.  3. The mitral valve is grossly normal. No evidence of  mitral valve regurgitation.  4. The aortic valve is grossly normal. Aortic valve regurgitation is trivial. Comparison(s): No prior Echocardiogram. FINDINGS  Left Ventricle: Left ventricular ejection fraction, by estimation, is 60 to 65%. The left ventricle has normal function. The left ventricle has no regional wall motion abnormalities. The left ventricular internal cavity size was normal in size. There is  mild left ventricular hypertrophy. Right Ventricle: The right ventricular size is normal. No increase in right ventricular wall thickness. Right ventricular systolic function is normal. There is normal pulmonary artery systolic pressure. The tricuspid regurgitant velocity is 2.35 m/s, and  with an assumed right atrial pressure of 3 mmHg, the estimated right ventricular systolic pressure is 32.6 mmHg. Left Atrium: Left atrial size was normal in size. Right Atrium: Right atrial size was normal in size. Pericardium: There is no evidence of pericardial effusion. Mitral Valve: The mitral valve is grossly normal. There is moderate calcification of the mitral valve leaflet(s). No evidence of mitral valve regurgitation. Tricuspid Valve: The tricuspid valve is grossly normal. Tricuspid valve regurgitation is not demonstrated. Aortic Valve: The aortic valve is grossly normal. Aortic valve regurgitation is trivial. Aortic regurgitation PHT measures 691 msec. Pulmonic Valve: The pulmonic valve was grossly normal. Pulmonic valve regurgitation is not visualized. Aorta: The aortic root and ascending aorta are structurally normal, with no evidence of dilitation. IAS/Shunts: No atrial level shunt detected by color flow Doppler.  LEFT VENTRICLE PLAX 2D LVIDd:         3.50 cm      Diastology LVIDs:         2.50 cm      LV e' medial:    6.00 cm/s LV PW:         1.00 cm      LV E/e' medial:  13.2 LV IVS:        1.20 cm      LV e' lateral:   11.10 cm/s LVOT diam:     2.20 cm      LV E/e' lateral: 7.1 LV SV:         72 LV SV Index:    40 LVOT Area:     3.80 cm  LV Volumes (MOD) LV vol d, MOD A2C: 72.3 ml LV vol d, MOD A4C: 106.0 ml LV vol s, MOD A2C: 29.5 ml LV vol s, MOD A4C: 39.3 ml LV SV MOD A2C:     42.8 ml LV SV MOD A4C:     106.0 ml LV SV MOD BP:      54.1 ml RIGHT VENTRICLE RV S prime:     11.30 cm/s TAPSE (M-mode): 2.0 cm LEFT ATRIUM           Index        RIGHT ATRIUM           Index LA diam:      4.50 cm 2.52 cm/m   RA Area:     12.00 cm LA Vol (A2C): 33.1 ml 18.55 ml/m  RA Volume:   25.60 ml  14.34  ml/m LA Vol (A4C): 40.9 ml 22.92 ml/m  AORTIC VALVE LVOT Vmax:         93.40 cm/s LVOT Vmean:        58.700 cm/s LVOT VTI:          0.189 m AI PHT:            691 msec AR Vena Contracta: 0.10 cm  AORTA Ao Root diam: 3.10 cm Ao Asc diam:  3.30 cm MITRAL VALVE               TRICUSPID VALVE MV Area (PHT): 2.99 cm    TR Peak grad:   22.1 mmHg MV Decel Time: 254 msec    TR Vmax:        235.00 cm/s MV E velocity: 79.30 cm/s MV A velocity: 45.00 cm/s  SHUNTS MV E/A ratio:  1.76        Systemic VTI:  0.19 m                            Systemic Diam: 2.20 cm Placido Sou signed by Phineas Inches Signature Date/Time: 02/23/2021/6:11:50 PM    Final       HISTORY OF PRESENT ILLNESS Dennis Fry is a 66 y.o. male with a history of atrial fibrillation on apixaban, HLD, DM and CAD with LAD stenting 3 months ago.  He recently arrived to the [place] from [place] to visit family on 1/29.  Patient's family states that he had some left hand weakness on arrival which has now worsened.  Patient also developed left leg weakness with gait disturbances as well as a headache.  He saw a cardiologist on 2/6 and was directed to have a head CT scan, which he underwent today.  CT revealed extensive subcortical left sided white matter low density with 5mm left to right midline shift as well as hyperdensity in right temporal horn likely representing choroid plexus.       LKW: 1/29 TNK given?: no, outside of window IR Thrombectomy? No, outside of  window Modified Rankin Scale: 0-Completely asymptomatic and back to baseline post- stroke   HOSPITAL COURSE Mr. Dennis Fry is a 66 y.o. male with history of afib on eliquis, DM, HLD, CAD s/p stenting 3 months ago admitted for left hand weakness, left leg weakness, HA and difficulty walking, progressive since 1/29.   Brain malignancy CT Extensive probable subcortical white matter low density is noted resulting in significant mass effect and 11 mm of right to left midline shift.  CTA head and neck - no LVO, regional mass effect with neovascularity within the right frontotemporal region, suspicious for an underlying malignant process.  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 Associated regional mass effect with up to 11 mm of right-to-left shift. 2D Echo  EF 60-65% LDL 57 HgbA1c 8.9 SCDs for VTE prophylaxis clopidogrel 75 mg daily and Eliquis (apixaban) daily prior to admission, nothing prescribed at [facility]   Afib NSR in hospital  On eliquis PTA Given brain tumor and significant MLS, would not recommend to continue eliquis at this time.    Diabetes HgbA1c 8.9 goal < 7.0 Uncontrolled Follow up with PCP in [place]   Hypertension Stable   Hyperlipidemia Home meds:  lipitor 40  LDL 57, goal < 70 OK to continue statin at home   Other active problems Coronary artery  disease s/p stenting 3 months ago   On the day of discharge, during the encounter, I had ipad interpreter with patient briefly and he was awake alert, orientated x 3. No aphasia or dysarthria, and expressed his wishes to be discharged and go back to [place]. Pt seems to be competent to make his own decision. Before I continued conversation with him, I discussed with son and nephew who can speak english out of courtesy about whether he knew his diagnosis and if he wanted to know whether I  could tell him. However, the son and nephew told me that they only told him that he had stroke and they did not want me to tell him about his real diagnosis. I told them that I understood not telling pt right diagnosis might be their cultural issue, however I have obligation in this hospital to tell pt his diagnosis if pt competent and wanted to know his diagnosis. However, we went into argument for this matter, so I paused conversation with pt and brought pt son and nephew to 4N consult room for further discussion. They stated that pt was going to be on airplane tonight back to [place]. If he knew his diagnosis and he then had heart attack in the airplane that would be my fault. I along with our neuro NP, 4N RN Tammy tried to explain to them that pt was competent and we had obligation to let him make his own informed consent about further plan. If pt did not want to know about his final diagnosis or condition, we would not tell him. However, if he did want to know, we had to tell him. Son and nephew became agitated and wanted to leave AMA on behalf of the pt. I tried to get back to talk to patient with ipad interpretor but family did not allow me. And RN Ander Purpura and Kieth Brightly had already dressed patient up and put pt into wheelchair. Family then wheeled patient out of unit. Pt and family did get decardron and keppra with them when going out of unit.     DISCHARGE EXAM Blood pressure (!) 114/57, pulse 70, temperature 97.7 F (36.5 C), temperature source Oral, resp. rate 15, height 5\' 5"  (1.651 m), weight 67 kg, SpO2 96 %.  General - Well nourished, well developed, in no apparent distress.   Level of arousal and orientation to time, place, and person were intact. Language including expression, and comprehension was assessed and found intact.   No further exam was able to performed as patient left AMA  Discharge Diet       There are no active orders of the following types: Diet, Nourishments.    liquids  DISCHARGE PLAN Disposition: Home with family Decadron and Keppra was given on discharge Patient will follow-up with his doctor when he back to [place]  30 minutes were spent preparing discharge.  Rosalin Hawking, MD PhD Stroke Neurology 02/25/2021 8:01 PM
